# Patient Record
Sex: Male | Born: 1965 | Race: Black or African American | Hispanic: No | Marital: Single | Smoking: Current some day smoker
Health system: Southern US, Community
[De-identification: ages and names within clinical notes are randomized; demographics above are authoritative.]

## PROBLEM LIST (undated history)

## (undated) DIAGNOSIS — K219 Gastro-esophageal reflux disease without esophagitis: Secondary | ICD-10-CM

## (undated) DIAGNOSIS — J45909 Unspecified asthma, uncomplicated: Secondary | ICD-10-CM

## (undated) HISTORY — DX: Gastro-esophageal reflux disease without esophagitis: K21.9

---

## 1999-09-11 ENCOUNTER — Emergency Department (HOSPITAL_COMMUNITY): Admission: EM | Admit: 1999-09-11 | Discharge: 1999-09-11 | Payer: Self-pay | Admitting: Emergency Medicine

## 2000-12-08 ENCOUNTER — Emergency Department (HOSPITAL_COMMUNITY): Admission: EM | Admit: 2000-12-08 | Discharge: 2000-12-08 | Payer: Self-pay | Admitting: Emergency Medicine

## 2000-12-08 ENCOUNTER — Encounter: Payer: Self-pay | Admitting: Emergency Medicine

## 2003-01-11 ENCOUNTER — Emergency Department (HOSPITAL_COMMUNITY): Admission: EM | Admit: 2003-01-11 | Discharge: 2003-01-11 | Payer: Self-pay | Admitting: Emergency Medicine

## 2003-01-13 ENCOUNTER — Emergency Department (HOSPITAL_COMMUNITY): Admission: EM | Admit: 2003-01-13 | Discharge: 2003-01-13 | Payer: Self-pay | Admitting: Emergency Medicine

## 2005-05-19 ENCOUNTER — Encounter: Admission: RE | Admit: 2005-05-19 | Discharge: 2005-08-17 | Payer: Self-pay | Admitting: Surgery

## 2010-01-06 ENCOUNTER — Emergency Department (HOSPITAL_COMMUNITY): Admission: EM | Admit: 2010-01-06 | Discharge: 2010-01-06 | Payer: Self-pay | Admitting: Emergency Medicine

## 2011-08-30 ENCOUNTER — Encounter (HOSPITAL_COMMUNITY): Payer: Self-pay | Admitting: *Deleted

## 2011-08-30 ENCOUNTER — Emergency Department (HOSPITAL_COMMUNITY)
Admission: EM | Admit: 2011-08-30 | Discharge: 2011-08-30 | Disposition: A | Discharge status: Home / Self Care | Payer: Self-pay | Attending: Emergency Medicine | Admitting: Emergency Medicine

## 2011-08-30 DIAGNOSIS — K219 Gastro-esophageal reflux disease without esophagitis: Secondary | ICD-10-CM | POA: Insufficient documentation

## 2011-08-30 MED ORDER — OMEPRAZOLE 20 MG PO CPDR: 20.0000 mg | DELAYED_RELEASE_CAPSULE | Freq: Two times a day (BID) | ORAL | Status: DC

## 2011-08-30 NOTE — ED Notes (Signed)
Pt reports wants to be checked out for acid reflux- states has burning sensation in throat at times. States happened again last night. Denies any pain at this time.

## 2011-08-30 NOTE — ED Provider Notes (Signed)
History     CSN: [id number]  Arrival date & time 08/30/11  0944   First MD Initiated Contact with Patient 08/30/11 1040      Chief Complaint  Patient presents with  . Gastrophageal Reflux    (Consider location/radiation/quality/duration/timing/severity/associated sxs/prior treatment) Patient is a 46 y.o. male presenting with GERD. The history is provided by the patient.  Gastrophageal Reflux This is a chronic problem. The current episode started more than 1 year ago. Pertinent negatives include no chills or fever. Associated symptoms comments: He complains of "acid in my throat" that occurs when he lays down at night to sleep. It is described as 'liquid from my stomach' that causes a burning in his throat. He denies cough, abdominal pain or chest burning/pain. He states it is worse when he eats later at night, closer to bed time, and when he eats pizza. He is a smoker. No fever, bloody component to 'liquid' or nausea/vomiting.. Treatments tried: He has tried Tums without relief.    History reviewed. No pertinent past medical history.  History reviewed. No pertinent past surgical history.  No family history on file.  History  Substance Use Topics  . Smoking status: Current Some Day Smoker    Types: Cigarettes  . Smokeless tobacco: Not on file  . Alcohol Use: No      Review of Systems  Constitutional: Negative for fever and chills.  HENT: Negative.        See HPI.  Respiratory: Negative.   Cardiovascular: Negative.   Gastrointestinal: Negative.   Skin: Negative.   Neurological: Negative.     Allergies  Review of patient's allergies indicates no known allergies.  Home Medications   Current Outpatient Rx  Name Route Sig Dispense Refill  . OVER THE COUNTER MEDICATION Oral Take 1 tablet by mouth daily. Pt takes an otc allergy medication.      BP 134/67  Pulse 88  Temp(Src) 97.6 F (36.4 C) (Oral)  Resp 16  SpO2 98%  Physical Exam  Constitutional: He appears  well-developed and well-nourished.  HENT:  Head: Normocephalic.  Mouth/Throat: Oropharynx is clear and moist.  Neck: Normal range of motion. Neck supple. No thyromegaly present.  Cardiovascular: Normal rate and regular rhythm.   Pulmonary/Chest: Effort normal and breath sounds normal.  Abdominal: Soft. Bowel sounds are normal. There is no tenderness. There is no rebound and no guarding.  Musculoskeletal: Normal range of motion.  Neurological: He is alert. No cranial nerve deficit.  Skin: Skin is warm and dry. No rash noted.  Psychiatric: He has a normal mood and affect.    ED Course  Procedures (including critical care time)  Labs Reviewed - No data to display No results found.   No diagnosis found.    MDM  Discussed that there is no direct test for acid reflux as requested. Given no abdominal pain, chest pain, fever and symptoms for chronic duration, doubt ulcerations or other emergent condition. Will prescribe Prilosec and refer to GI in one week for recheck, further evaluation.        Rodena Medin, PA-C 08/30/11 1135

## 2011-08-30 NOTE — Discharge Instructions (Signed)
TAKE PRILOSEC TWICE DAILY. FOLLOW UP WITH DR. Dulce Sellar IN ONE WEEK BY CALLING THE OFFICE IN THE MORNING TO SCHEDULE A TIME TO BE SEEN. RETURN HERE WITH ANY HIGH FEVER, SEVERE PAIN OR NEW CONCERN.  .Diet for GERD or PUD Nutrition therapy can help ease the discomfort of gastroesophageal reflux disease (GERD) and peptic ulcer disease (PUD).  HOME CARE INSTRUCTIONS   Eat your meals slowly, in a relaxed setting.   Eat 5 to 6 small meals per day.   If a food causes distress, stop eating it for a period of time.  FOODS TO AVOID  Coffee, regular or decaffeinated.   Cola beverages, regular or low calorie.   Tea, regular or decaffeinated.   Pepper.   Cocoa.   High fat foods, including meats.   Butter, margarine, hydrogenated oil (trans fats).   Peppermint or spearmint (if you have GERD).   Fruits and vegetables if not tolerated.   Alcohol.   Nicotine (smoking or chewing). This is one of the most potent stimulants to acid production in the gastrointestinal tract.   Any food that seems to aggravate your condition.  If you have questions regarding your diet, ask your caregiver or a registered dietitian. TIPS  Lying flat may make symptoms worse. Keep the head of your bed raised 6 to 9 inches (15 to 23 cm) by using a foam wedge or blocks under the legs of the bed.   Do not lay down until 3 hours after eating a meal.   Daily physical activity may help reduce symptoms.  MAKE SURE YOU:   Understand these instructions.   Will watch your condition.   Will get help right away if you are not doing well or get worse.  Document Released: 06/07/2005 Document Revised: 05/27/2011 Document Reviewed: 04/23/2011 [facility] Patient Information 2012 [place], [place].

## 2011-09-07 ENCOUNTER — Encounter (HOSPITAL_COMMUNITY): Payer: Self-pay | Admitting: Emergency Medicine

## 2011-09-07 ENCOUNTER — Emergency Department (HOSPITAL_COMMUNITY)
Admission: EM | Admit: 2011-09-07 | Discharge: 2011-09-07 | Disposition: A | Discharge status: Home / Self Care | Payer: No Typology Code available for payment source | Attending: Emergency Medicine | Admitting: Emergency Medicine

## 2011-09-07 DIAGNOSIS — Y998 Other external cause status: Secondary | ICD-10-CM | POA: Insufficient documentation

## 2011-09-07 DIAGNOSIS — F17200 Nicotine dependence, unspecified, uncomplicated: Secondary | ICD-10-CM | POA: Insufficient documentation

## 2011-09-07 DIAGNOSIS — S39012A Strain of muscle, fascia and tendon of lower back, initial encounter: Secondary | ICD-10-CM

## 2011-09-07 DIAGNOSIS — Z79899 Other long term (current) drug therapy: Secondary | ICD-10-CM | POA: Insufficient documentation

## 2011-09-07 DIAGNOSIS — S161XXA Strain of muscle, fascia and tendon at neck level, initial encounter: Secondary | ICD-10-CM

## 2011-09-07 DIAGNOSIS — Y93I9 Activity, other involving external motion: Secondary | ICD-10-CM | POA: Insufficient documentation

## 2011-09-07 DIAGNOSIS — S139XXA Sprain of joints and ligaments of unspecified parts of neck, initial encounter: Secondary | ICD-10-CM | POA: Insufficient documentation

## 2011-09-07 DIAGNOSIS — IMO0002 Reserved for concepts with insufficient information to code with codable children: Secondary | ICD-10-CM | POA: Insufficient documentation

## 2011-09-07 MED ORDER — DIAZEPAM 5 MG PO TABS: 5.0000 mg | ORAL_TABLET | Freq: Two times a day (BID) | ORAL | Status: AC

## 2011-09-07 MED ORDER — DIAZEPAM 5 MG PO TABS
5.0000 mg | ORAL_TABLET | Freq: Once | ORAL | Status: AC
  Administered 2011-09-07: 5 mg via ORAL
  Filled 2011-09-07: qty 1

## 2011-09-07 MED ORDER — KETOROLAC TROMETHAMINE 60 MG/2ML IM SOLN
60.0000 mg | Freq: Once | INTRAMUSCULAR | Status: DC
  Filled 2011-09-07: qty 2

## 2011-09-07 MED ORDER — NAPROXEN 500 MG PO TABS: 500.0000 mg | ORAL_TABLET | Freq: Two times a day (BID) | ORAL | Status: AC
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
## 2011-09-08 ENCOUNTER — Emergency Department (HOSPITAL_COMMUNITY)
Admission: EM | Admit: 2011-09-08 | Discharge: 2011-09-09 | Disposition: A | Discharge status: Home / Self Care | Payer: No Typology Code available for payment source | Attending: Emergency Medicine | Admitting: Emergency Medicine

## 2011-09-08 ENCOUNTER — Emergency Department (HOSPITAL_COMMUNITY): Payer: No Typology Code available for payment source

## 2011-09-08 ENCOUNTER — Encounter (HOSPITAL_COMMUNITY): Payer: Self-pay | Admitting: Emergency Medicine

## 2011-09-08 DIAGNOSIS — F17200 Nicotine dependence, unspecified, uncomplicated: Secondary | ICD-10-CM | POA: Insufficient documentation

## 2011-09-08 DIAGNOSIS — M545 Low back pain, unspecified: Secondary | ICD-10-CM | POA: Insufficient documentation

## 2011-09-08 DIAGNOSIS — S339XXA Sprain of unspecified parts of lumbar spine and pelvis, initial encounter: Secondary | ICD-10-CM | POA: Insufficient documentation

## 2011-09-08 DIAGNOSIS — S39012A Strain of muscle, fascia and tendon of lower back, initial encounter: Secondary | ICD-10-CM
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
## 2011-09-09 MED ORDER — HYDROCODONE-ACETAMINOPHEN 5-325 MG PO TABS
1.0000 | ORAL_TABLET | Freq: Once | ORAL | Status: AC
  Administered 2011-09-09: 1 via ORAL
  Filled 2011-09-09: qty 1

## 2011-09-09 MED ORDER — HYDROCODONE-ACETAMINOPHEN 5-325 MG PO TABS: 1.0000 | ORAL_TABLET | ORAL | Status: AC | PRN

## 2011-09-09 MED ORDER — DIAZEPAM 5 MG PO TABS
5.0000 mg | ORAL_TABLET | Freq: Once | ORAL | Status: DC
  Filled 2011-09-09: qty 1

## 2011-09-09 MED ORDER — IBUPROFEN 800 MG PO TABS
800.0000 mg | ORAL_TABLET | Freq: Once | ORAL | Status: AC
  Administered 2011-09-09: 800 mg via ORAL
  Filled 2011-09-09: qty 1

## 2011-09-09 NOTE — ED Notes (Signed)
Patient called because he can not get to [facility] to get to see referral MD. Patient given number to [facility] since they were on call.
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
## 2011-09-09 NOTE — ED Notes (Signed)
The pt is sleeping and snoring loudly

## 2011-09-09 NOTE — ED Notes (Signed)
The pt did not want the valium.   He has some at home

## 2011-09-09 NOTE — Discharge Instructions (Signed)
  Please review the instructions below. Your x-ray tonight shows no acute injury or broken bones. The likely source of your symptoms is muscle strain in your lower back. Continue the anti-inflammatory and muscle relaxer that you were prescribed yesterday. I have prescribed a very short course of medication for pain as you should not need stronger medication for pain at this point. If the pain persists you will need to arrange follow up with the orthopedic referral provided.     Muscle Strain A muscle strain, or pulled muscle, occurs when a muscle is over-stretched. A small number of muscle fibers may also be torn. This is especially common in athletes. This happens when a sudden violent force placed on a muscle pushes it past its capacity. Usually, recovery from a pulled muscle takes 1 to 2 weeks. But complete healing will take 5 to 6 weeks. There are millions of muscle fibers. Following injury, your body will usually return to normal quickly. HOME CARE INSTRUCTIONS   While awake, apply ice to the sore muscle for 15 to 20 minutes each hour for the first 2 days. Put ice in a plastic bag and place a towel between the bag of ice and your skin.   Do not use the pulled muscle for several days. Do not use the muscle if you have pain.   You may wrap the injured area with an elastic bandage for comfort. Be careful not to bind it too tightly. This may interfere with blood circulation.   Only take over-the-counter or prescription medicines for pain, discomfort, or fever as directed by your caregiver. Do not use aspirin as this will increase bleeding (bruising) at injury site.   Warming up before exercise helps prevent muscle strains.  SEEK MEDICAL CARE IF:  There is increased pain or swelling in the affected area. MAKE SURE YOU:   Understand these instructions.   Will watch your condition.   Will get help right away if you are not doing well or get worse.  Document Released: 06/07/2005 Document  Revised: 05/27/2011 Document Reviewed: 01/04/2007 [facility] Patient Information 2012 [place], [place].

## 2011-09-16 NOTE — ED Provider Notes (Signed)
Evaluation and management procedures were performed by the PA/NP under my supervision/collaboration.   Yolunda Kloos D Rasheema Truluck, MD 09/16/11 1012 

## 2011-09-16 NOTE — ED Provider Notes (Signed)
Evaluation and management procedures were performed by the PA/NP under my supervision/collaboration.   Jencarlo Bonadonna D Viviene Thurston, MD 09/16/11 1025 

## 2013-04-24 ENCOUNTER — Encounter (HOSPITAL_COMMUNITY): Payer: Self-pay | Admitting: Emergency Medicine

## 2013-04-24 ENCOUNTER — Emergency Department (HOSPITAL_COMMUNITY)
Admission: EM | Admit: 2013-04-24 | Discharge: 2013-04-24 | Disposition: A | Discharge status: Home / Self Care | Payer: Self-pay | Attending: Emergency Medicine | Admitting: Emergency Medicine

## 2013-04-24 ENCOUNTER — Emergency Department (HOSPITAL_COMMUNITY): Payer: Self-pay

## 2013-04-24 DIAGNOSIS — Z79899 Other long term (current) drug therapy: Secondary | ICD-10-CM | POA: Insufficient documentation

## 2013-04-24 DIAGNOSIS — R0789 Other chest pain: Secondary | ICD-10-CM | POA: Insufficient documentation

## 2013-04-24 DIAGNOSIS — F17200 Nicotine dependence, unspecified, uncomplicated: Secondary | ICD-10-CM | POA: Insufficient documentation

## 2013-04-24 DIAGNOSIS — J069 Acute upper respiratory infection, unspecified: Secondary | ICD-10-CM | POA: Insufficient documentation

## 2013-04-24 MED ORDER — ALBUTEROL SULFATE HFA 108 (90 BASE) MCG/ACT IN AERS
2.0000 | INHALATION_SPRAY | Freq: Four times a day (QID) | RESPIRATORY_TRACT | Status: DC | PRN
  Administered 2013-04-24: 2 via RESPIRATORY_TRACT
  Filled 2013-04-24: qty 6.7

## 2013-04-24 MED ORDER — AZITHROMYCIN 250 MG PO TABS: 250.0000 mg | ORAL_TABLET | Freq: Every day | ORAL | Status: DC

## 2013-04-24 NOTE — ED Notes (Signed)
Sore throat x 1 week. "may have had fever". Use OTC dayquil

## 2013-04-24 NOTE — ED Provider Notes (Signed)
Medical screening examination/treatment/procedure(s) were performed by non-physician practitioner and as supervising physician I was immediately available for consultation/collaboration.  EKG Interpretation   None         Audree Camel, MD 04/24/13 1436

## 2013-04-24 NOTE — ED Provider Notes (Signed)
CSN: [id number]     Arrival date & time 04/24/13  1221 History  This chart was scribed for non-physician practitioner working with Audree Camel, MD by Ashley Jacobs, ED scribe. This patient was seen in room WTR8/WTR8 and the patient's care was started at 1:07 PM.  First MD Initiated Contact with Patient 04/24/13 1246     No chief complaint on file.  (Consider location/radiation/quality/duration/timing/severity/associated sxs/prior Treatment) The history is provided by the patient and medical records. No language interpreter was used.   HPI Comments: Scott Vance is a 47 y.o. male who presents to the Emergency Department complaining of moderate waxing waning sore throat for the past week. He states his having chest soreness, chest congestion,  fever (unmeasured )and a productive cough with yellow sputum. Pt mentions an episode of some streaky blood in sputum the night PTA. Pt has tried Vicks' Vapor Rub and mucus decongestants.  Pt does not have any known allergies or any previous medical complications.  No past medical history on file. No past surgical history on file. No family history on file. History  Substance Use Topics  . Smoking status: Current Some Day Smoker    Types: Cigarettes  . Smokeless tobacco: Not on file  . Alcohol Use: No    Review of Systems  Constitutional: Positive for fever.  HENT: Positive for sore throat.   Respiratory: Positive for cough and chest tightness.   All other systems reviewed and are negative.    Allergies  Review of patient's allergies indicates no known allergies.  Home Medications   Current Outpatient Rx  Name  Route  Sig  Dispense  Refill  . guaiFENesin (MUCINEX) 600 MG 12 hr tablet   Oral   Take 1,200 mg by mouth 2 (two) times daily.         . Pseudoephedrine-APAP-DM (DAYQUIL MULTI-SYMPTOM COLD/FLU PO)   Oral   Take 30 mLs by mouth 2 (two) times daily.          BP 118/71  Pulse 90  Temp(Src) 98.1 F (36.7 C) (Oral)   Resp 18  SpO2 98% Physical Exam  Nursing note and vitals reviewed. Constitutional: He is oriented to person, place, and time. He appears well-developed and well-nourished. No distress.  HENT:  Head: Normocephalic and atraumatic.  oropharynx mildly inflamed No tonsillary exudates Uvula midline Airway intact No signs of abscess   Eyes: EOM are normal.  Neck: Neck supple. No tracheal deviation present.  Cardiovascular: Normal rate and regular rhythm.  Exam reveals no gallop and no friction rub.   No murmur heard. Pulmonary/Chest: Effort normal and breath sounds normal. No respiratory distress. He has no wheezes. He has no rales. He exhibits no tenderness.  Abdominal: He exhibits no distension.  Musculoskeletal: Normal range of motion.  Neurological: He is alert and oriented to person, place, and time.  Skin: Skin is warm and dry.  Psychiatric: He has a normal mood and affect. His behavior is normal.    ED Course  Procedures (including critical care time) DIAGNOSTIC STUDIES: Oxygen Saturation is 98% on room air, normal by my interpretation.    COORDINATION OF CARE: 1:11 PM Discussed course of care with pt which includes Rapid Strep test and chest xrays. Pt understands and agrees. Results for orders placed during the hospital encounter of 04/24/13  RAPID STREP SCREEN      Result Value Range   Streptococcus, Group A Screen (Direct) NEGATIVE  NEGATIVE   Dg Chest 2 View  04/24/2013  CLINICAL DATA:  Chest pain and cough.  EXAM: CHEST  2 VIEW  COMPARISON:  01/06/2010.  FINDINGS: Mild right perihilar interstitial prominence. Mild pneumonitis cannot be excluded. Exam is otherwise unremarkable. Heart size normal. Mediastinum and hilar structures are unremarkable. Bony structures normal.  IMPRESSION: Mild right perihilar interstitial prominence. Very mild changes of pneumonitis cannot be excluded.   Electronically Signed   By: Maisie Fus  Register   On: 04/24/2013 13:45      EKG  Interpretation   None       MDM   1. URI (upper respiratory infection)    Pt CXR negative for acute infiltrate. Patients symptoms are consistent with URI, likely viral etiology. Discussed that antibiotics are not indicated for viral infections. Pt will be discharged with symptomatic treatment.  Verbalizes understanding and is agreeable with plan. Pt is hemodynamically stable & in NAD prior to dc.   Perc negative  I personally performed the services described in this documentation, which was scribed in my presence. The recorded information has been reviewed and is accurate.     Roxy Horseman, PA-C 04/24/13 1352

## 2013-04-26 LAB — CULTURE, GROUP A STREP

## 2014-04-15 ENCOUNTER — Emergency Department (HOSPITAL_COMMUNITY): Payer: Self-pay

## 2014-04-15 ENCOUNTER — Encounter (HOSPITAL_COMMUNITY): Payer: Self-pay | Admitting: Emergency Medicine

## 2014-04-15 ENCOUNTER — Emergency Department (HOSPITAL_COMMUNITY)
Admission: EM | Admit: 2014-04-15 | Discharge: 2014-04-15 | Disposition: A | Discharge status: Home / Self Care | Payer: Self-pay | Attending: Emergency Medicine | Admitting: Emergency Medicine

## 2014-04-15 DIAGNOSIS — Z720 Tobacco use: Secondary | ICD-10-CM | POA: Insufficient documentation

## 2014-04-15 DIAGNOSIS — E279 Disorder of adrenal gland, unspecified: Secondary | ICD-10-CM | POA: Insufficient documentation

## 2014-04-15 DIAGNOSIS — K859 Acute pancreatitis, unspecified: Secondary | ICD-10-CM | POA: Insufficient documentation

## 2014-04-15 DIAGNOSIS — R109 Unspecified abdominal pain: Secondary | ICD-10-CM

## 2014-04-15 DIAGNOSIS — N281 Cyst of kidney, acquired: Secondary | ICD-10-CM | POA: Insufficient documentation

## 2014-04-15 DIAGNOSIS — R1012 Left upper quadrant pain: Secondary | ICD-10-CM | POA: Insufficient documentation

## 2014-04-15 DIAGNOSIS — E278 Other specified disorders of adrenal gland: Secondary | ICD-10-CM

## 2014-04-15 LAB — CBC WITH DIFFERENTIAL/PLATELET
BASOS PCT: 1 % (ref 0–1)
Basophils Absolute: 0 10*3/uL (ref 0.0–0.1)
EOS ABS: 0.1 10*3/uL (ref 0.0–0.7)
EOS PCT: 3 % (ref 0–5)
HCT: 46.5 % (ref 39.0–52.0)
HEMOGLOBIN: 16.5 g/dL (ref 13.0–17.0)
Lymphocytes Relative: 43 % (ref 12–46)
Lymphs Abs: 1.6 10*3/uL (ref 0.7–4.0)
MCH: 33.1 pg (ref 26.0–34.0)
MCHC: 35.5 g/dL (ref 30.0–36.0)
MCV: 93.2 fL (ref 78.0–100.0)
MONOS PCT: 9 % (ref 3–12)
Monocytes Absolute: 0.3 10*3/uL (ref 0.1–1.0)
NEUTROS PCT: 44 % (ref 43–77)
Neutro Abs: 1.6 10*3/uL — ABNORMAL LOW (ref 1.7–7.7)
PLATELETS: 212 10*3/uL (ref 150–400)
RBC: 4.99 MIL/uL (ref 4.22–5.81)
RDW: 12.5 % (ref 11.5–15.5)
WBC: 3.6 10*3/uL — ABNORMAL LOW (ref 4.0–10.5)

## 2014-04-15 LAB — URINALYSIS, ROUTINE W REFLEX MICROSCOPIC
Bilirubin Urine: NEGATIVE
Glucose, UA: NEGATIVE mg/dL
HGB URINE DIPSTICK: NEGATIVE
Ketones, ur: NEGATIVE mg/dL
LEUKOCYTES UA: NEGATIVE
NITRITE: NEGATIVE
PROTEIN: NEGATIVE mg/dL
SPECIFIC GRAVITY, URINE: 1.023 (ref 1.005–1.030)
UROBILINOGEN UA: 1 mg/dL (ref 0.0–1.0)
pH: 7 (ref 5.0–8.0)

## 2014-04-15 LAB — COMPREHENSIVE METABOLIC PANEL
ALBUMIN: 3.9 g/dL (ref 3.5–5.2)
ALK PHOS: 85 U/L (ref 39–117)
ALT: 20 U/L (ref 0–53)
ANION GAP: 11 (ref 5–15)
AST: 19 U/L (ref 0–37)
BUN: 10 mg/dL (ref 6–23)
CALCIUM: 9.1 mg/dL (ref 8.4–10.5)
CO2: 25 mEq/L (ref 19–32)
Chloride: 105 mEq/L (ref 96–112)
Creatinine, Ser: 0.95 mg/dL (ref 0.50–1.35)
GFR calc Af Amer: >90 mL/min (ref >90)
GFR calc non Af Amer: >90 mL/min (ref >90)
Glucose, Bld: 101 mg/dL — ABNORMAL HIGH (ref 70–99)
POTASSIUM: 4 meq/L (ref 3.7–5.3)
Sodium: 141 mEq/L (ref 137–147)
TOTAL PROTEIN: 7.4 g/dL (ref 6.0–8.3)
Total Bilirubin: 0.3 mg/dL (ref 0.3–1.2)

## 2014-04-15 LAB — LIPASE, BLOOD: LIPASE: 151 U/L — AB (ref 11–59)

## 2014-04-15 MED ORDER — IOHEXOL 300 MG/ML  SOLN
100.0000 mL | Freq: Once | INTRAMUSCULAR | Status: AC | PRN
  Administered 2014-04-15: 100 mL via INTRAVENOUS

## 2014-04-15 MED ORDER — FAMOTIDINE 20 MG PO TABS: 20.0000 mg | ORAL_TABLET | Freq: Two times a day (BID) | ORAL | Status: DC

## 2014-04-15 MED ORDER — HYDROCODONE-ACETAMINOPHEN 5-325 MG PO TABS: 1.0000 | ORAL_TABLET | ORAL | Status: DC | PRN

## 2014-04-15 MED ORDER — IOHEXOL 300 MG/ML  SOLN
50.0000 mL | Freq: Once | INTRAMUSCULAR | Status: AC | PRN
  Administered 2014-04-15: 50 mL via ORAL

## 2014-04-15 NOTE — ED Provider Notes (Signed)
CSN: [id number]     Arrival date & time 04/15/14  1353 History   First MD Initiated Contact with Patient 04/15/14 1620     Chief Complaint  Patient presents with  . Abdominal Pain     (Consider location/radiation/quality/duration/timing/severity/associated sxs/prior Treatment) HPI Scott Vance is a 48 y.o. male who presents to emergency department complaining of epigastric abdominal pain for 1 week. States symptoms began after eating a chicken [place] from [employer]. Patient denies any associated nausea or vomiting. No diarrhea. Normal bowel movement today. States "I think I have food poisoning." He did try taking Pepto for his symptoms with no relief. He denies any alcohol drinking. He does take Prevacid for headaches regularly. He denies any blood in his stool or blood tarry stools. He denies any fever or chills. No back pain. No abdominal pain radiation. Nothing makes it better or worse. Patient is able to eat without difficulty. No hx of the same.   History reviewed. No pertinent past medical history. No past surgical history on file. No family history on file. History  Substance Use Topics  . Smoking status: Current Some Day Smoker    Types: Cigarettes  . Smokeless tobacco: Not on file  . Alcohol Use: No    Review of Systems  Constitutional: Negative for fever and chills.  Respiratory: Negative for cough, chest tightness and shortness of breath.   Cardiovascular: Negative for chest pain, palpitations and leg swelling.  Gastrointestinal: Positive for abdominal pain. Negative for nausea, vomiting, diarrhea and abdominal distention.  Genitourinary: Negative for dysuria, urgency, frequency and hematuria.  Musculoskeletal: Negative for arthralgias, myalgias, neck pain and neck stiffness.  Skin: Negative for rash.  Allergic/Immunologic: Negative for immunocompromised state.  Neurological: Negative for dizziness, weakness, light-headedness, numbness and headaches.  All other  systems reviewed and are negative.     Allergies  Review of patient's allergies indicates no known allergies.  Home Medications   Prior to Admission medications   Medication Sig Start Date End Date Taking? Authorizing Provider  bismuth subsalicylate (PEPTO BISMOL) 262 MG/15ML suspension Take 30 mLs by mouth every 6 (six) hours as needed (stomach indigestion).   Yes Historical Provider, MD  GuaiFENesin (MUCUS RELIEF ADULT PO) Take 2 tablets by mouth daily as needed (cold symptoms).   Yes Historical Provider, MD  sodium-potassium bicarbonate (ALKA-SELTZER GOLD) TBEF dissolvable tablet Take 1 tablet by mouth daily as needed (indigestion).   Yes Historical Provider, MD   BP 148/85  Pulse 80  Temp(Src) 97.5 F (36.4 C) (Oral)  Resp 18  SpO2 98% Physical Exam  Nursing note and vitals reviewed. Constitutional: He is oriented to person, place, and time. He appears well-developed and well-nourished. No distress.  HENT:  Head: Normocephalic and atraumatic.  Right Ear: External ear normal.  Left Ear: External ear normal.  Mouth/Throat: Oropharynx is clear and moist.  Eyes: Conjunctivae are normal.  Neck: Normal range of motion. Neck supple.  No meningeal signs  Cardiovascular: Normal rate, regular rhythm and normal heart sounds.   Pulmonary/Chest: Effort normal and breath sounds normal. No respiratory distress. He has no wheezes. He has no rales.  Abdominal: Soft. Bowel sounds are normal. There is tenderness. There is no rebound and no guarding.  LUQ tenderness. No CVA tednerness  Musculoskeletal: He exhibits no edema and no tenderness.  Lymphadenopathy:    He has no cervical adenopathy.  Neurological: He is alert and oriented to person, place, and time.  Skin: Skin is warm and dry. No erythema.  Psychiatric: He has a normal mood and affect.    ED Course  Procedures (including critical care time) Labs Review Labs Reviewed  CBC WITH DIFFERENTIAL - Abnormal; Notable for the  following:    WBC 3.6 (*)    Neutro Abs 1.6 (*)    All other components within normal limits  COMPREHENSIVE METABOLIC PANEL - Abnormal; Notable for the following:    Glucose, Bld 101 (*)    All other components within normal limits  LIPASE, BLOOD - Abnormal; Notable for the following:    Lipase 151 (*)    All other components within normal limits  URINALYSIS, ROUTINE W REFLEX MICROSCOPIC - Abnormal; Notable for the following:    APPearance CLOUDY (*)    All other components within normal limits    Imaging Review Ct Abdomen Pelvis W Contrast  04/15/2014   CLINICAL DATA:  Mid abdominal pain after eating. Patient's symptoms for 1 week. Patient believes he had food poisoning 1 week ago. No vomiting.  EXAM: CT ABDOMEN AND PELVIS WITH CONTRAST  TECHNIQUE: Multidetector CT imaging of the abdomen and pelvis was performed using the standard protocol following bolus administration of intravenous contrast.  CONTRAST:  50mL OMNIPAQUE IOHEXOL 300 MG/ML SOLN, 100mL OMNIPAQUE IOHEXOL 300 MG/ML SOLN  COMPARISON:  None.  FINDINGS: Clear lung bases.  Heart is normal in size.  Liver, spleen, gallbladder, pancreas: Normal.  8 mm right adrenal mass, nonspecific. Adrenal glands otherwise unremarkable.  Low-density left midpole renal mass measuring 9 mm, most likely a cyst. No other renal abnormality. No hydronephrosis. Normal ureters. Bladder only minimally distended but otherwise unremarkable.  No pathologically enlarged lymph nodes. No abnormal fluid collections.  There are few sigmoid colon diverticula. No diverticulitis. Colon otherwise unremarkable. Normal small bowel. Normal appendix.  No evidence of stomach wall thickening or of an ulcer. No mesenteric inflammation.  No significant bony abnormality.  IMPRESSION: 1. No acute findings.  No findings to explain mid abdominal pain. 2. 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 3. 9 mm left renal mass, most likely a cyst but too small to fully  characterize. 4. Several sigmoid colon diverticula.  No diverticulitis. 5. No other abnormalities.   Electronically Signed   By: Amie Portlandavid  Ormond M.D.   On: 04/15/2014 18:29     EKG Interpretation None      MDM   Final diagnoses:  Abdominal pain  Acute pancreatitis, unspecified pancreatitis type  Cyst of left kidney  Adrenal mass, right    Pt with LUQ abdominal pain for 1 week. No N/V/D. Will get labs. Abdomen benign. VS normal.     Labs showed mildly elevated lipase at 150. Pt is not a drinker. No RUQ pain. No N/V. Not on any medications. ill add CT abd/pelvis.    6:53 PM CT negative for acute finding. Did show right adrenal mass and left kidney cyst. No evidence of pancreatitis on CT. At this time, i belive pt is stable for outpatient follow up. Pain minimal at present. Tolerating POs. Pt ate entire take out box of food that his friend brought him when was told he was NPO. Discussed that we would put him on clear liquid diet for 48 hrs, anti acids, pain meds. Follow up with pcp. Return precautions discussed.   Filed Vitals:   04/15/14 1421 04/15/14 1834 04/15/14 1837  BP: 133/75 148/85   Pulse: 94 80   Temp: 98.4 F (36.9 C)  97.5 F (36.4 C)  TempSrc: Oral  Oral  Resp: 18 18   SpO2: 100% 98%      Oather Muilenburg A Jonesha Tsuchiya, PA-C 04/16/14 0030

## 2014-04-15 NOTE — Discharge Instructions (Signed)
Pepcid for acid reflux as prescribed. Norco for severe pain. Clear liquids for 24 hrs. Follow up with primary care doctor. Return if symptoms worsening   Acute Pancreatitis Acute pancreatitis is a disease in which the pancreas becomes suddenly inflamed. The pancreas is a large gland located behind your stomach. The pancreas produces enzymes that help digest food. The pancreas also releases the hormones glucagon and insulin that help regulate blood sugar. Damage to the pancreas occurs when the digestive enzymes from the pancreas are activated and begin attacking the pancreas before being released into the intestine. Most acute attacks last a couple of days and can cause serious complications. Some people become dehydrated and develop low blood pressure. In severe cases, bleeding into the pancreas can lead to shock and can be life-threatening. The lungs, heart, and kidneys may fail. CAUSES  Pancreatitis can happen to anyone. In some cases, the cause is unknown. Most cases are caused by:  Alcohol abuse.  Gallstones. Other less common causes are:  Certain medicines.  Exposure to certain chemicals.  Infection.  Damage caused by an accident (trauma).  Abdominal surgery. SYMPTOMS   Pain in the upper abdomen that may radiate to the back.  Tenderness and swelling of the abdomen.  Nausea and vomiting. DIAGNOSIS  Your caregiver will perform a physical exam. Blood and stool tests may be done to confirm the diagnosis. Imaging tests may also be done, such as X-rays, CT scans, or an ultrasound of the abdomen. TREATMENT  Treatment usually requires a stay in the hospital. Treatment may include:  Pain medicine.  Fluid replacement through an intravenous line (IV).  Placing a tube in the stomach to remove stomach contents and control vomiting.  Not eating for 3 or 4 days. This gives your pancreas a rest, because enzymes are not being produced that can cause further damage.  Antibiotic medicines  if your condition is caused by an infection.  Surgery of the pancreas or gallbladder. HOME CARE INSTRUCTIONS   Follow the diet advised by your caregiver. This may involve avoiding alcohol and decreasing the amount of fat in your diet.  Eat smaller, more frequent meals. This reduces the amount of digestive juices the pancreas produces.  Drink enough fluids to keep your urine clear or pale yellow.  Only take over-the-counter or prescription medicines as directed by your caregiver.  Avoid drinking alcohol if it caused your condition.  Do not smoke.  Get plenty of rest.  Check your blood sugar at home as directed by your caregiver.  Keep all follow-up appointments as directed by your caregiver. SEEK MEDICAL CARE IF:   You do not recover as quickly as expected.  You develop new or worsening symptoms.  You have persistent pain, weakness, or nausea.  You recover and then have another episode of pain. SEEK IMMEDIATE MEDICAL CARE IF:   You are unable to eat or keep fluids down.  Your pain becomes severe.  You have a fever or persistent symptoms for more than 2 to 3 days.  You have a fever and your symptoms suddenly get worse.  Your skin or the white part of your eyes turn yellow (jaundice).  You develop vomiting.  You feel dizzy, or you faint.  Your blood sugar is high (over 300 mg/dL). MAKE SURE YOU:   Understand these instructions.  Will watch your condition.  Will get help right away if you are not doing well or get worse. Document Released: 06/07/2005 Document Revised: 12/07/2011 Document Reviewed: 09/16/2011 [employer] Patient Information  2015 [employer]. This information is not intended to replace advice given to you by your health care provider. Make sure you discuss any questions you have with your health care provider. ° °

## 2014-04-15 NOTE — ED Notes (Signed)
Pt believes he has food poisoning x 1 wk w/ gen abd pain.  No NVD.

## 2014-04-16 NOTE — ED Provider Notes (Signed)
Medical screening examination/treatment/procedure(s) were performed by non-physician practitioner and as supervising physician I was immediately available for consultation/collaboration.   EKG Interpretation None        Gilda Creasehristopher J. Pollina, MD 04/16/14 [phone]

## 2015-02-24 ENCOUNTER — Encounter (HOSPITAL_COMMUNITY): Payer: Self-pay | Admitting: *Deleted

## 2015-02-24 ENCOUNTER — Emergency Department (HOSPITAL_COMMUNITY)
Admission: EM | Admit: 2015-02-24 | Discharge: 2015-02-24 | Disposition: A | Discharge status: Home / Self Care | Payer: Self-pay | Attending: Emergency Medicine | Admitting: Emergency Medicine

## 2015-02-24 DIAGNOSIS — Z79899 Other long term (current) drug therapy: Secondary | ICD-10-CM | POA: Insufficient documentation

## 2015-02-24 DIAGNOSIS — Z720 Tobacco use: Secondary | ICD-10-CM | POA: Insufficient documentation

## 2015-02-24 DIAGNOSIS — H109 Unspecified conjunctivitis: Secondary | ICD-10-CM | POA: Insufficient documentation

## 2015-02-24 MED ORDER — TETRACAINE HCL 0.5 % OP SOLN
2.0000 [drp] | Freq: Once | OPHTHALMIC | Status: AC
  Administered 2015-02-24: 2 [drp] via OPHTHALMIC
  Filled 2015-02-24: qty 2

## 2015-02-24 MED ORDER — NEOMYCIN-POLYMYXIN-DEXAMETH 3.5-10000-0.1 OP OINT
TOPICAL_OINTMENT | Freq: Three times a day (TID) | OPHTHALMIC | Status: DC
  Administered 2015-02-24: 14:00:00 via OPHTHALMIC
  Filled 2015-02-24: qty 3.5

## 2015-02-24 MED ORDER — FLUORESCEIN SODIUM 1 MG OP STRP
1.0000 | ORAL_STRIP | Freq: Once | OPHTHALMIC | Status: AC
  Administered 2015-02-24: 1 via OPHTHALMIC
  Filled 2015-02-24: qty 1

## 2015-02-24 NOTE — Discharge Instructions (Signed)
Cool compresses to the eye. Eye drops three times a day for 7 days. Follow up with eye specialist if not improving.    Conjunctivitis Conjunctivitis is commonly called "pink eye." Conjunctivitis can be caused by bacterial or viral infection, allergies, or injuries. There is usually redness of the lining of the eye, itching, discomfort, and sometimes discharge. There may be deposits of matter along the eyelids. A viral infection usually causes a watery discharge, while a bacterial infection causes a yellowish, thick discharge. Pink eye is very contagious and spreads by direct contact. You may be given antibiotic eyedrops as part of your treatment. Before using your eye medicine, remove all drainage from the eye by washing gently with warm water and cotton balls. Continue to use the medication until you have awakened 2 mornings in a row without discharge from the eye. Do not rub your eye. This increases the irritation and helps spread infection. Use separate towels from other household members. Wash your hands with soap and water before and after touching your eyes. Use cold compresses to reduce pain and sunglasses to relieve irritation from light. Do not wear contact lenses or wear eye makeup until the infection is gone. SEEK MEDICAL CARE IF:   Your symptoms are not better after 3 days of treatment.  You have increased pain or trouble seeing.  The outer eyelids become very red or swollen. Document Released: 07/15/2004 Document Revised: 08/30/2011 Document Reviewed: 06/07/2005 [facility] Patient Information 2015 [place], [place]. This information is not intended to replace advice given to you by your health care provider. Make sure you discuss any questions you have with your health care provider.

## 2015-02-24 NOTE — ED Provider Notes (Signed)
History  This chart was scribed for non-physician practitioner, Jaynie Crumble, PA-C,working with Mancel Bale, MD, by Karle Plumber, ED Scribe. This patient was seen in room WTR8/WTR8 and the patient's care was started at 1:12 PM.  Chief Complaint  Patient presents with  . Eye Problem    The history is provided by the patient and medical records. No language interpreter was used.    HPI Comments:  Scott Vance is a 49 y.o. male who presents to the Emergency Department complaining of aching, sharp, burning right eye pain that began about one week ago. He reports associated itching, photophobia, tearing and intermittent blurriness. He states he has been instilling OTC ophthalmic drops with no significant relief of the symptoms. Covering his eye with his hand helps alleviate the pain. He denies worsening factors. He denies fever, chills, HA, nausea or vomiting. He denies any injury or trauma to the eye. He denies wearing corrective lenses or contact lenses. He does not have an eye doctor.   History reviewed. No pertinent past medical history. History reviewed. No pertinent past surgical history. History reviewed. No pertinent family history. Social History  Substance Use Topics  . Smoking status: Current Some Day Smoker    Types: Cigarettes  . Smokeless tobacco: None  . Alcohol Use: No    Review of Systems  Constitutional: Negative for fever and chills.  Eyes: Positive for photophobia, pain, discharge, itching and visual disturbance.  Gastrointestinal: Negative for nausea and vomiting.  Neurological: Negative for headaches.    Allergies  Review of patient's allergies indicates no known allergies.  Home Medications   Prior to Admission medications   Medication Sig Start Date End Date Taking? Authorizing Provider  bismuth subsalicylate (PEPTO BISMOL) 262 MG/15ML suspension Take 30 mLs by mouth every 6 (six) hours as needed (stomach indigestion).    Historical Provider, MD   famotidine (PEPCID) 20 MG tablet Take 1 tablet (20 mg total) by mouth 2 (two) times daily. 04/15/14   Climmie Buelow, PA-C  GuaiFENesin (MUCUS RELIEF ADULT PO) Take 2 tablets by mouth daily as needed (cold symptoms).    Historical Provider, MD  HYDROcodone-acetaminophen (NORCO/VICODIN) 5-325 MG per tablet Take 1 tablet by mouth every 4 (four) hours as needed for moderate pain or severe pain. 04/15/14   Kaira Stringfield, PA-C  sodium-potassium bicarbonate (ALKA-SELTZER GOLD) TBEF dissolvable tablet Take 1 tablet by mouth daily as needed (indigestion).    Historical Provider, MD   Triage Vitals: BP 132/78 mmHg  Pulse 74  Temp(Src) 98.2 F (36.8 C) (Oral)  Resp 16  SpO2 98% Physical Exam  Constitutional: He is oriented to person, place, and time. He appears well-developed and well-nourished.  HENT:  Head: Normocephalic and atraumatic.  Eyes: Conjunctivae and EOM are normal. Pupils are equal, round, and reactive to light. Right eye exhibits no exudate and no hordeolum. No foreign body present in the right eye. Right conjunctiva is not injected. Right conjunctiva has no hemorrhage. Left conjunctiva is not injected. Left conjunctiva has no hemorrhage.  Slit lamp exam:      The right eye shows no corneal abrasion, no corneal flare, no corneal ulcer, no foreign body, no hyphema and no hypopyon.  Neck: Normal range of motion.  Cardiovascular: Normal rate.   Pulmonary/Chest: Effort normal.  Musculoskeletal: Normal range of motion.  Neurological: He is alert and oriented to person, place, and time.  Skin: Skin is warm and dry.  Psychiatric: He has a normal mood and affect. His behavior is normal.  Nursing note and vitals reviewed.   ED Course  Procedures (including critical care time) DIAGNOSTIC STUDIES: Oxygen Saturation is 98% on RA, normal by my interpretation.   COORDINATION OF CARE: 1:15 PM- Will instill Tetracaine opthalmic drops and apply fluorescein strip to examine eye. Pt  verbalizes understanding and agrees to plan.  Medications  tetracaine (PONTOCAINE) 0.5 % ophthalmic solution 2 drop (not administered)  fluorescein ophthalmic strip 1 strip (not administered)    Labs Review Labs Reviewed - No data to display  Imaging Review No results found. I have personally reviewed and evaluated these images and lab results as part of my medical decision-making.   EKG Interpretation None      MDM   Final diagnoses:  Conjunctivitis, right eye    Pt with right eye itching, pain, intermittent blurred vision. Eye is not injected. No drainage on exam. Fluorescein stain negative. Eye pressure average 18 in the right eye. Will follow up with ophthalmology regarding his visual acuity and for further evaluation if not improving. Will treat with antibiotics steroid drops. Most likely allergic conjunctivitis versus bacterial conjunctivitis. Return precautions discussed.   Filed Vitals:   02/24/15 1205  BP: 132/78  Pulse: 74  Temp: 98.2 F (36.8 C)  TempSrc: Oral  Resp: 16  SpO2: 98%    I personally performed the services described in this documentation, which was scribed in my presence. The recorded information has been reviewed and is accurate.    Jaynie Crumble, PA-C 02/24/15 1351  Mancel Bale, MD 02/24/15 1524

## 2015-02-24 NOTE — ED Notes (Signed)
Pt reports right eye irritation x1 week. Pain 7/10. Has been using eye drops with no relief. Denies trauma or injury.

## 2015-02-24 NOTE — ED Notes (Signed)
Pt is a&ox4, ambulatory. He denied questions r/t dc instruction.

## 2015-06-17 ENCOUNTER — Encounter (HOSPITAL_COMMUNITY): Payer: Self-pay | Admitting: Family Medicine

## 2015-06-17 ENCOUNTER — Emergency Department (HOSPITAL_COMMUNITY)
Admission: EM | Admit: 2015-06-17 | Discharge: 2015-06-17 | Disposition: A | Discharge status: Home / Self Care | Payer: Self-pay | Attending: Physician Assistant | Admitting: Physician Assistant

## 2015-06-17 DIAGNOSIS — F17210 Nicotine dependence, cigarettes, uncomplicated: Secondary | ICD-10-CM | POA: Insufficient documentation

## 2015-06-17 DIAGNOSIS — R0981 Nasal congestion: Secondary | ICD-10-CM | POA: Insufficient documentation

## 2015-06-17 DIAGNOSIS — Z79899 Other long term (current) drug therapy: Secondary | ICD-10-CM | POA: Insufficient documentation

## 2015-06-17 MED ORDER — FLUTICASONE PROPIONATE 50 MCG/ACT NA SUSP: 2.0000 | Freq: Every day | NASAL | Status: DC

## 2015-06-17 NOTE — ED Notes (Signed)
Patient reports he is experiencing a "blockage" to his left nostril. Pt reports the feels like there is a bump in his nose that is swelling the left side of nose and upper lip. No obvious major swelling noted. Pt is delaying in answering questions due to using cell phone while triaging.

## 2015-06-17 NOTE — ED Provider Notes (Signed)
History  By signing my name below, I, Karle Plumber, attest that this documentation has been prepared under the direction and in the presence of Sharilyn Sites, PA-C. Electronically Signed: Karle Plumber, ED Scribe. 06/17/2015. 9:24 PM.  Chief Complaint  Patient presents with  . Nasal Congestion   The history is provided by the patient and medical records. No language interpreter was used.    HPI Comments:  Scott Vance is a 49 y.o. obese male who presents to the Emergency Department complaining of sharp left nasal pain and congestion that began about four . He states he feels as if there is a bump in the left nare. He has tried an OTC nasal spray with no significant relief of the symptoms but he cannot remember the names. He also applied peroxide to a tissue and placed it in his nose. He denies modifying factors. He denies fever, chills, nausea or vomiting.  No cough, chest congestion, or SOB.  VSS.  History reviewed. No pertinent past medical history. History reviewed. No pertinent past surgical history. History reviewed. No pertinent family history. Social History  Substance Use Topics  . Smoking status: Current Some Day Smoker    Types: Cigarettes  . Smokeless tobacco: None  . Alcohol Use: No    Review of Systems  Constitutional: Negative for fever and chills.  HENT: Positive for congestion.   Gastrointestinal: Negative for nausea and vomiting.  All other systems reviewed and are negative.   Allergies  Review of patient's allergies indicates no known allergies.  Home Medications   Prior to Admission medications   Medication Sig Start Date End Date Taking? Authorizing Provider  bismuth subsalicylate (PEPTO BISMOL) 262 MG/15ML suspension Take 30 mLs by mouth every 6 (six) hours as needed (stomach indigestion).    Historical Provider, MD  famotidine (PEPCID) 20 MG tablet Take 1 tablet (20 mg total) by mouth 2 (two) times daily. 04/15/14   Tatyana Kirichenko, PA-C   GuaiFENesin (MUCUS RELIEF ADULT PO) Take 2 tablets by mouth daily as needed (cold symptoms).    Historical Provider, MD  HYDROcodone-acetaminophen (NORCO/VICODIN) 5-325 MG per tablet Take 1 tablet by mouth every 4 (four) hours as needed for moderate pain or severe pain. 04/15/14   Tatyana Kirichenko, PA-C  sodium-potassium bicarbonate (ALKA-SELTZER GOLD) TBEF dissolvable tablet Take 1 tablet by mouth daily as needed (indigestion).    Historical Provider, MD   Triage Vitals: BP 153/87 mmHg  Pulse 87  Temp(Src) 98.2 F (36.8 C) (Oral)  Resp 18  Ht  (1.727 m)  Wt 230 lb (104.327 kg)  BMI 34.98 kg/m2  SpO2 99% Physical Exam  Constitutional: He is oriented to person, place, and time. He appears well-developed and well-nourished.  HENT:  Head: Normocephalic and atraumatic.  Right Ear: Tympanic membrane and ear canal normal.  Left Ear: Tympanic membrane and ear canal normal.  Nose: Mucosal edema present.  Mouth/Throat: Uvula is midline, oropharynx is clear and moist and mucous membranes are normal. No oropharyngeal exudate, posterior oropharyngeal edema, posterior oropharyngeal erythema or tonsillar abscesses.  Mucosal edema in left nares-- turbinates swollen and erythematous; no external swelling of nose or lip noted  Eyes: Conjunctivae and EOM are normal. Pupils are equal, round, and reactive to light.  Neck: Normal range of motion.  Cardiovascular: Normal rate, regular rhythm and normal heart sounds.   Pulmonary/Chest: Effort normal and breath sounds normal.  Abdominal: Soft. Bowel sounds are normal.  Musculoskeletal: Normal range of motion.  Neurological: He is alert and oriented  to person, place, and time.  Skin: Skin is warm and dry.  Psychiatric: He has a normal mood and affect.  Nursing note and vitals reviewed.   ED Course  Procedures (including critical care time) DIAGNOSTIC STUDIES: Oxygen Saturation is 99% on RA, normal by my interpretation.   COORDINATION OF  CARE: 9:21 PM- Will prescribe Flonase. Pt verbalizes understanding and agrees to plan.  Medications - No data to display   MDM   Final diagnoses:  Nasal congestion   49 y.o. M here with left sided nasal congestion.  He reports left nasal/lip swelling, however none noted on exam.  His left turbinates are swollen and erythematous, right nare appears normal.  Lungs clear without any respiratory symptoms.  Will start on flonase.  Discussed plan with patient, he/she acknowledged understanding and agreed with plan of care.  Return precautions given for new or worsening symptoms.  I personally performed the services described in this documentation, which was scribed in my presence. The recorded information has been reviewed and is accurate.  Garlon HatchetLisa M Sanders, PA-C 06/17/15 2143  Courteney Randall AnLyn Mackuen, MD 06/18/15 [id number]

## 2015-06-17 NOTE — Discharge Instructions (Signed)
Take the prescribed medication as directed.  May also wish to take over the counter cold medication such as theraflu, dayquil, tylenol cold and sinus. Follow-up with your primary care physician. Return to the ED for new or worsening symptoms.

## 2015-06-17 NOTE — ED Notes (Signed)
Attempted to call report but no answer.

## 2016-02-14 ENCOUNTER — Emergency Department (HOSPITAL_COMMUNITY)
Admission: EM | Admit: 2016-02-14 | Discharge: 2016-02-14 | Disposition: A | Discharge status: Home / Self Care | Payer: Self-pay | Attending: Emergency Medicine | Admitting: Emergency Medicine

## 2016-02-14 ENCOUNTER — Encounter (HOSPITAL_COMMUNITY): Payer: Self-pay | Admitting: *Deleted

## 2016-02-14 DIAGNOSIS — R609 Edema, unspecified: Secondary | ICD-10-CM | POA: Insufficient documentation

## 2016-02-14 DIAGNOSIS — Z79899 Other long term (current) drug therapy: Secondary | ICD-10-CM | POA: Insufficient documentation

## 2016-02-14 DIAGNOSIS — Z7951 Long term (current) use of inhaled steroids: Secondary | ICD-10-CM | POA: Insufficient documentation

## 2016-02-14 DIAGNOSIS — F17210 Nicotine dependence, cigarettes, uncomplicated: Secondary | ICD-10-CM | POA: Insufficient documentation

## 2016-02-14 DIAGNOSIS — W5503XA Scratched by cat, initial encounter: Secondary | ICD-10-CM | POA: Insufficient documentation

## 2016-02-14 DIAGNOSIS — R220 Localized swelling, mass and lump, head: Secondary | ICD-10-CM

## 2016-02-14 DIAGNOSIS — Y999 Unspecified external cause status: Secondary | ICD-10-CM | POA: Insufficient documentation

## 2016-02-14 DIAGNOSIS — Y929 Unspecified place or not applicable: Secondary | ICD-10-CM | POA: Insufficient documentation

## 2016-02-14 DIAGNOSIS — Y939 Activity, unspecified: Secondary | ICD-10-CM | POA: Insufficient documentation

## 2016-02-14 MED ORDER — ACETAMINOPHEN 325 MG PO TABS
650.0000 mg | ORAL_TABLET | Freq: Once | ORAL | Status: AC
  Administered 2016-02-14: 650 mg via ORAL
  Filled 2016-02-14: qty 2

## 2016-02-14 NOTE — Discharge Instructions (Signed)
Read the information below.   Your cat scratch does not show any obvious signs of infection at this time. You do not have a fever. Continue to keep the scratch clean and dry. You can continue to apply antibiotic ointment. Please take tylenol 650mg  every 6hrs of motrin 400mg  every 6hrs for pain and swelling. Apply ice for 20 minute increments for the next 2-3 days.  If the facial swelling persists over the next week, please follow up with a primary doctor, I [facility] as well as additional resources. You may return to the Emergency Department at any time for worsening condition or any new symptoms that concern you. Return to the ED if you develop a fever, difficulty swallowing, difficulty breathing, have redness/warmth/swelling/drainage or streaking at site of scratches.

## 2016-02-14 NOTE — ED Triage Notes (Signed)
Pt complains of swelling to left side of his face since a cat bit him on the face Thursday.

## 2016-02-14 NOTE — Progress Notes (Signed)
Pt states he was scratched by a cat on Thursday. He has two small scratch marks on the left ankle. Pt stated, "the lady that owned the cat said it had all its shots. But you know a person will tell you that." pt also c/o swelling to his left jaw and a bump on his head .

## 2016-02-14 NOTE — ED Provider Notes (Signed)
[facility]-EMERGENCY DEPT Provider Note   CSN: [id number] Arrival date & time: 02/14/16  1549  By signing my name below, I, Nelwyn Salisbury, attest that this documentation has been prepared under the direction and in the presence of non-physician practitioner, Arvilla Meres, PA-C.Marland Kitchen Electronically Signed: Nelwyn Salisbury, Scribe. 02/14/2016. 5:19 PM.  History   Chief Complaint Chief Complaint  Patient presents with  . Animal Bite   The history is provided by the patient. No language interpreter was used.     HPI Comments:  Scott Vance is a 50 y.o. male who presents to the Emergency Department complaining of cat scratch s/p 2 days ago. Patient reports he was scratched by a friend's cat on his lower leg 2 days ago. No cat bite. Cat is UTD on vaccines, indoors, and no known exposure to rabies. He noticed later in the day he had left sided facial swelling with some associated pain and is concerned about a bacterial infection or rabies from the cat scratch. He notes he has been keeping the scratch clean and applying neosporin. He reports associated fatigue and chills. He denies fever, weight change, URI symptoms, dental pain, oral swelling, sore throat, tinnitus, trouble eating, trouble swallowing, changes in vision, headache, numbness, weakness, chest pain, shortness of breath, rash, abdominal pain, nausea, vomiting, myalgias, neck pain, dysuria, hematuria, or any immunocompromising conditions. No h/o cancer. No known trauma.     History reviewed. No pertinent past medical history.  There are no active problems to display for this patient.   History reviewed. No pertinent surgical history.   Home Medications    Prior to Admission medications   Medication Sig Start Date End Date Taking? Authorizing Provider  bismuth subsalicylate (PEPTO BISMOL) 262 MG/15ML suspension Take 30 mLs by mouth every 6 (six) hours as needed (stomach indigestion).    Historical Provider, MD  famotidine (PEPCID) 20 MG  tablet Take 1 tablet (20 mg total) by mouth 2 (two) times daily. 04/15/14   Tatyana Kirichenko, PA-C  fluticasone (FLONASE) 50 MCG/ACT nasal spray Place 2 sprays into both nostrils daily. 06/17/15   Garlon Hatchet, PA-C  GuaiFENesin (MUCUS RELIEF ADULT PO) Take 2 tablets by mouth daily as needed (cold symptoms).    Historical Provider, MD  HYDROcodone-acetaminophen (NORCO/VICODIN) 5-325 MG per tablet Take 1 tablet by mouth every 4 (four) hours as needed for moderate pain or severe pain. 04/15/14   Tatyana Kirichenko, PA-C  sodium-potassium bicarbonate (ALKA-SELTZER GOLD) TBEF dissolvable tablet Take 1 tablet by mouth daily as needed (indigestion).    Historical Provider, MD    Family History No family history on file.  Social History Social History  Substance Use Topics  . Smoking status: Current Some Day Smoker    Types: Cigarettes  . Smokeless tobacco: Never Used  . Alcohol use No     Allergies   Review of patient's allergies indicates no known allergies.   Review of Systems Review of Systems  Constitutional: Positive for fatigue. Negative for fever and unexpected weight change.  HENT: Positive for facial swelling. Negative for dental problem, sore throat, tinnitus and trouble swallowing.   Respiratory: Negative for shortness of breath.   Cardiovascular: Negative for chest pain.  Gastrointestinal: Negative for nausea and vomiting.  Genitourinary: Negative for dysuria and hematuria.  Musculoskeletal: Positive for myalgias (Jaw).  Skin: Positive for wound. Negative for rash.  Allergic/Immunologic: Negative for immunocompromised state.  Neurological: Negative for weakness, numbness and headaches.     Physical Exam Updated Vital Signs BP 141/68 (  BP Location: Left Arm)   Pulse 92   Temp 98.6 F (37 C) (Oral)   Resp 20   Ht 5\' 10"  (1.778 m)   Wt 99.8 kg   SpO2 94%   BMI 31.57 kg/m   Physical Exam  Constitutional: He appears well-developed and well-nourished. No  distress.  HENT:  Head: Normocephalic and atraumatic.  Nose: Right sinus exhibits no maxillary sinus tenderness and no frontal sinus tenderness. Left sinus exhibits no maxillary sinus tenderness and no frontal sinus tenderness.  Mouth/Throat: Uvula is midline, oropharynx is clear and moist and mucous membranes are normal. No oral lesions. No trismus in the jaw. No uvula swelling. No oropharyngeal exudate. No tonsillar exudate.  Mild left sided facial swelling noted. No warmth, erythema, induration, or drainage. No trismus. Uvula midline. No sublingual or soft palate swelling. No erythema of posterior oropharynx. No tonsillar hypertrophy or exudate.   Eyes: Conjunctivae and EOM are normal. Pupils are equal, round, and reactive to light. Right eye exhibits no discharge. Left eye exhibits no discharge. No scleral icterus.  Neck: Normal range of motion and phonation normal. Neck supple. No spinous process tenderness and no muscular tenderness present. No neck rigidity. Normal range of motion present.  Cardiovascular: Normal rate, regular rhythm, normal heart sounds and intact distal pulses.   No murmur heard. Pulmonary/Chest: Effort normal and breath sounds normal. No stridor. No respiratory distress. He has no wheezes. He has no rales.  Abdominal: Soft. Bowel sounds are normal. He exhibits no distension. There is no tenderness. There is no rigidity, no rebound and no guarding.  Musculoskeletal: Normal range of motion.  Lymphadenopathy:    He has no cervical adenopathy.  Neurological: He is alert. He is not disoriented. Coordination and gait normal. GCS eye subscore is 4. GCS verbal subscore is 5. GCS motor subscore is 6.  Skin: Skin is warm and dry. Abrasion noted. He is not diaphoretic.     Psychiatric: He has a normal mood and affect. His behavior is normal. Judgment normal.  Nursing note and vitals reviewed.    ED Treatments / Results  DIAGNOSTIC STUDIES:  Oxygen Saturation is 94% on RA,  adequate by my interpretation.    COORDINATION OF CARE:  5:17 PM Discussed treatment plan with pt at bedside and pt agreed to plan.  Labs (all labs ordered are listed, but only abnormal results are displayed) Labs Reviewed - No data to display  EKG  EKG Interpretation None       Radiology No results found.  Procedures Procedures (including critical care time)  Medications Ordered in ED Medications  acetaminophen (TYLENOL) tablet 650 mg (650 mg Oral Given 02/14/16 1839)     Initial Impression / Assessment and Plan / ED Course  I have reviewed the triage vital signs and the nursing notes.  Pertinent labs & imaging results that were available during my care of the patient were reviewed by me and considered in my medical decision making (see chart for details).  Clinical Course    Patient presents to ED for cat scratch and facial swelling. Patient is afebrile and non-toxic appearing in NAD. Vital signs remarkable for mildly elevated BP,  otherwise stable. Physical exam remarkable for well healing superficial scratches on left lower extremity. No evidence of puncture wounds. No evidence of infection. Patient highly concerned about possible rabies or bacterial infection from cat scratch. Per cat owners, cat is UTD on vaccines, indoor cat, and no known rabies exposure. No signs of infection at site  or cat scratch. Patient is afebrile and non-toxic appearing. Low suspicion for rabies. Low suspicion for systemic infection. Do not think PO ABX warranted at this time. Discussed with patient risks of unnecessary ABX use. Encouraged continued symptomatic management of scratch with hygiene and ABX ointment. Discussed signs of infection and indications for return. Patient voiced understanding and is agreeable.   Left sided facial swelling noted. No trauma noted. No erythema, warmth, induration, or discharge noted. No oral cavity swelling, sore throat, or pain. Patient is managing oral  secretions. No trismus. No sublingual swelling. No stridor. Phonation is normal. No neck pain or rigidity. Neck ROM intact. No SOB. Low suspicion for deep space infection. Unclear etiology. Given patient is managing oral secretions and airway intact, can be evaluated OP. Discussed conservative management with tylenol/motrin and icing. If swelling persists follow up with PCP in 3-5 days for further evaluation, resources provided. Return precautions discussed. Patient voiced understanding and is agreeable.   I personally performed the services described in this documentation, which was scribed in my presence. The recorded information has been reviewed and is accurate.  Final Clinical Impressions(s) / ED Diagnoses   Final diagnoses:  Cat scratch  Facial swelling    New Prescriptions Discharge Medication List as of 02/14/2016  6:33 PM        Lona KettleAshley Laurel Esvin Hnat, PA-C 02/16/16 1007    Benjiman CoreNathan Pickering, MD 02/16/16 2156

## 2017-10-15 ENCOUNTER — Emergency Department (HOSPITAL_COMMUNITY)
Admission: EM | Admit: 2017-10-15 | Discharge: 2017-10-15 | Disposition: A | Discharge status: Home / Self Care | Payer: Self-pay | Attending: Emergency Medicine | Admitting: Emergency Medicine

## 2017-10-15 ENCOUNTER — Other Ambulatory Visit: Payer: Self-pay

## 2017-10-15 ENCOUNTER — Encounter (HOSPITAL_COMMUNITY): Payer: Self-pay | Admitting: *Deleted

## 2017-10-15 DIAGNOSIS — Z79899 Other long term (current) drug therapy: Secondary | ICD-10-CM | POA: Insufficient documentation

## 2017-10-15 DIAGNOSIS — F17210 Nicotine dependence, cigarettes, uncomplicated: Secondary | ICD-10-CM | POA: Insufficient documentation

## 2017-10-15 DIAGNOSIS — R1013 Epigastric pain: Secondary | ICD-10-CM | POA: Insufficient documentation

## 2017-10-15 LAB — COMPREHENSIVE METABOLIC PANEL
ALT: 20 U/L (ref 17–63)
ANION GAP: 9 (ref 5–15)
AST: 22 U/L (ref 15–41)
Albumin: 3.9 g/dL (ref 3.5–5.0)
Alkaline Phosphatase: 82 U/L (ref 38–126)
BUN: 6 mg/dL (ref 6–20)
CHLORIDE: 105 mmol/L (ref 101–111)
CO2: 23 mmol/L (ref 22–32)
Calcium: 8.6 mg/dL — ABNORMAL LOW (ref 8.9–10.3)
Creatinine, Ser: 1 mg/dL (ref 0.61–1.24)
GFR calc non Af Amer: >60 mL/min (ref >60)
Glucose, Bld: 117 mg/dL — ABNORMAL HIGH (ref 65–99)
POTASSIUM: 3.7 mmol/L (ref 3.5–5.1)
SODIUM: 137 mmol/L (ref 135–145)
Total Bilirubin: 0.9 mg/dL (ref 0.3–1.2)
Total Protein: 7 g/dL (ref 6.5–8.1)

## 2017-10-15 LAB — CBC
HCT: 45.8 % (ref 39.0–52.0)
HEMOGLOBIN: 16.6 g/dL (ref 13.0–17.0)
MCH: 34.4 pg — AB (ref 26.0–34.0)
MCHC: 36.2 g/dL — ABNORMAL HIGH (ref 30.0–36.0)
MCV: 94.8 fL (ref 78.0–100.0)
Platelets: 210 10*3/uL (ref 150–400)
RBC: 4.83 MIL/uL (ref 4.22–5.81)
RDW: 12.6 % (ref 11.5–15.5)
WBC: 3.4 10*3/uL — ABNORMAL LOW (ref 4.0–10.5)

## 2017-10-15 LAB — URINALYSIS, ROUTINE W REFLEX MICROSCOPIC
Bilirubin Urine: NEGATIVE
Glucose, UA: NEGATIVE mg/dL
Ketones, ur: NEGATIVE mg/dL
Leukocytes, UA: NEGATIVE
Nitrite: NEGATIVE
PROTEIN: NEGATIVE mg/dL
SPECIFIC GRAVITY, URINE: 1.006 (ref 1.005–1.030)
pH: 5 (ref 5.0–8.0)

## 2017-10-15 LAB — LIPASE, BLOOD: LIPASE: 75 U/L — AB (ref 11–51)

## 2017-10-15 MED ORDER — DICYCLOMINE HCL 20 MG PO TABS: 20.0000 mg | ORAL_TABLET | Freq: Two times a day (BID) | ORAL | 0 refills | Status: DC

## 2017-10-15 MED ORDER — SUCRALFATE 1 G PO TABS: 1.0000 g | ORAL_TABLET | Freq: Three times a day (TID) | ORAL | 0 refills | Status: DC

## 2017-10-15 MED ORDER — GI COCKTAIL ~~LOC~~
30.0000 mL | Freq: Once | ORAL | Status: AC
  Administered 2017-10-15: 30 mL via ORAL
  Filled 2017-10-15: qty 30

## 2017-10-15 MED ORDER — FAMOTIDINE 20 MG PO TABS: 20.0000 mg | ORAL_TABLET | Freq: Two times a day (BID) | ORAL | 0 refills | Status: DC

## 2017-10-15 NOTE — ED Triage Notes (Signed)
The pt is c/o abd pain  For 3 days no nausea vomiting or diarrhea

## 2017-10-15 NOTE — Discharge Instructions (Addendum)

## 2017-10-15 NOTE — ED Provider Notes (Signed)
[facility] EMERGENCY DEPARTMENT Provider Note   CSN: [id number] Arrival date & time: 10/15/17  8295     History   Chief Complaint Chief Complaint  Patient presents with  . Abdominal Pain    HPI Scott Vance is a 52 y.o. male resents to the emergency department chief complaint of epigastric abdominal pain.  Patient states that the pain began about 3 days ago nothing seems to make it worse or better.  At times it radiates to his back.  He states that it is also in the right upper quadrant.  He is never had any pain like this before.  He has had some associated reflux symptoms.  He took ibuprofen, Tylenol, acid reducing medication x2 without any relief of his symptoms.  He denies nausea vomiting constipation diarrhea.  He did try a laxative to see if his abdominal pain would improve.  He is a daily smoker but he denies regular alcohol use, regular NSAID use.  He denies previous abdominal surgeries.  He denies chest pain or shortness of breath.  HPI  History reviewed. No pertinent past medical history.  There are no active problems to display for this patient.   History reviewed. No pertinent surgical history.      Home Medications    Prior to Admission medications   Medication Sig Start Date End Date Taking? Authorizing Provider  bismuth subsalicylate (PEPTO BISMOL) 262 MG/15ML suspension Take 30 mLs by mouth every 6 (six) hours as needed (stomach indigestion).    [provider]  famotidine (PEPCID) 20 MG tablet Take 1 tablet (20 mg total) by mouth 2 (two) times daily. 04/15/14   Kirichenko, Tatyana, PA-C  fluticasone (FLONASE) 50 MCG/ACT nasal spray Place 2 sprays into both nostrils daily. 06/17/15   Garlon Hatchet, PA-C  GuaiFENesin (MUCUS RELIEF ADULT PO) Take 2 tablets by mouth daily as needed (cold symptoms).    [provider]  HYDROcodone-acetaminophen (NORCO/VICODIN) 5-325 MG per tablet Take 1 tablet by mouth every 4 (four) hours as  needed for moderate pain or severe pain. 04/15/14   Kirichenko, Lemont Fillers, PA-C  sodium-potassium bicarbonate (ALKA-SELTZER GOLD) TBEF dissolvable tablet Take 1 tablet by mouth daily as needed (indigestion).    [provider]    Family History No family history on file.  Social History Social History   Tobacco Use  . Smoking status: Current Some Day Smoker    Types: Cigarettes  . Smokeless tobacco: Never Used  Substance Use Topics  . Alcohol use: No  . Drug use: No     Allergies   Patient has no known allergies.   Review of Systems Review of Systems  Ten systems reviewed and are negative for acute change, except as noted in the HPI.   Physical Exam Updated Vital Signs BP (!) 144/99   Pulse 68   Temp 98.8 F (37.1 C) (Oral)   Resp 18   Ht  (1.854 m)   Wt 102.1 kg (225 lb)   SpO2 100%   BMI 29.69 kg/m   Physical Exam  Constitutional: He appears well-developed and well-nourished. No distress.  HENT:  Head: Normocephalic and atraumatic.  Eyes: Conjunctivae are normal. No scleral icterus.  Neck: Normal range of motion. Neck supple.  Cardiovascular: Normal rate, regular rhythm and normal heart sounds.  Pulmonary/Chest: Effort normal and breath sounds normal. No respiratory distress.  Abdominal: Soft. Bowel sounds are normal. There is no tenderness. There is no rigidity, no rebound, no guarding and  no CVA tenderness.  Musculoskeletal: He exhibits no edema.  Neurological: He is alert.  Skin: Skin is warm and dry. He is not diaphoretic.  Psychiatric: His behavior is normal.  Nursing note and vitals reviewed.    ED Treatments / Results  Labs (all labs ordered are listed, but only abnormal results are displayed) Labs Reviewed  LIPASE, BLOOD - Abnormal; Notable for the following components:      Result Value   Lipase 75 (*)    All other components within normal limits  COMPREHENSIVE METABOLIC PANEL - Abnormal; Notable for the following components:     Glucose, Bld 117 (*)    Calcium 8.6 (*)    All other components within normal limits  CBC - Abnormal; Notable for the following components:   WBC 3.4 (*)    MCH 34.4 (*)    MCHC 36.2 (*)    All other components within normal limits  URINALYSIS, ROUTINE W REFLEX MICROSCOPIC - Abnormal; Notable for the following components:   Hgb urine dipstick SMALL (*)    Bacteria, UA RARE (*)    All other components within normal limits    EKG None  Radiology No results found.  Procedures Procedures (including critical care time)  Medications Ordered in ED Medications  gi cocktail (Maalox,Lidocaine,Donnatal) (has no administration in time range)     Initial Impression / Assessment and Plan / ED Course  I have reviewed the triage vital signs and the nursing notes.  Pertinent labs & imaging results that were available during my care of the patient were reviewed by me and considered in my medical decision making (see chart for details).     With epigastric abdominal pain.  Seen for similar evaluation about 4 years ago.  He denies alcohol abuse.  Differential diagnosis includes peptic ulcer disease, gastritis, biliary colic although less secondary to the fact that there is no evidence of liver enzyme evaluation and no Murphy sign.  His pancreatic enzymes are slightly elevated but I doubt acute pancreatitis.  Has had no active vomiting here and a benign abdominal exam.  He appears appropriate for discharge at this time.  Final Clinical Impressions(s) / ED Diagnoses   Final diagnoses:  Epigastric pain    ED Discharge Orders    None       Arthor Captain, PA-C 10/15/17 1614    Shaune Pollack, MD 10/17/17 1327

## 2018-01-11 ENCOUNTER — Emergency Department (HOSPITAL_COMMUNITY): Payer: No Typology Code available for payment source

## 2018-01-11 ENCOUNTER — Encounter (HOSPITAL_COMMUNITY): Payer: Self-pay | Admitting: Family Medicine

## 2018-01-11 ENCOUNTER — Emergency Department (HOSPITAL_COMMUNITY)
Admission: EM | Admit: 2018-01-11 | Discharge: 2018-01-12 | Disposition: A | Discharge status: Home / Self Care | Payer: No Typology Code available for payment source | Attending: Emergency Medicine | Admitting: Emergency Medicine

## 2018-01-11 DIAGNOSIS — M79622 Pain in left upper arm: Secondary | ICD-10-CM

## 2018-01-11 DIAGNOSIS — Y939 Activity, unspecified: Secondary | ICD-10-CM | POA: Diagnosis not present

## 2018-01-11 DIAGNOSIS — S199XXA Unspecified injury of neck, initial encounter: Secondary | ICD-10-CM | POA: Diagnosis present

## 2018-01-11 DIAGNOSIS — Y999 Unspecified external cause status: Secondary | ICD-10-CM | POA: Insufficient documentation

## 2018-01-11 DIAGNOSIS — S161XXA Strain of muscle, fascia and tendon at neck level, initial encounter: Secondary | ICD-10-CM

## 2018-01-11 DIAGNOSIS — Z79899 Other long term (current) drug therapy: Secondary | ICD-10-CM | POA: Diagnosis not present

## 2018-01-11 DIAGNOSIS — F17210 Nicotine dependence, cigarettes, uncomplicated: Secondary | ICD-10-CM | POA: Diagnosis not present

## 2018-01-11 DIAGNOSIS — Y92410 Unspecified street and highway as the place of occurrence of the external cause: Secondary | ICD-10-CM | POA: Diagnosis not present

## 2018-01-11 MED ORDER — OXYCODONE-ACETAMINOPHEN 5-325 MG PO TABS
1.0000 | ORAL_TABLET | Freq: Once | ORAL | Status: AC
  Administered 2018-01-11: 1 via ORAL
  Filled 2018-01-11: qty 1

## 2018-01-11 MED ORDER — CYCLOBENZAPRINE HCL 10 MG PO TABS
5.0000 mg | ORAL_TABLET | Freq: Once | ORAL | Status: AC
  Administered 2018-01-11: 5 mg via ORAL
  Filled 2018-01-11: qty 1

## 2018-01-11 MED ORDER — IBUPROFEN 200 MG PO TABS
600.0000 mg | ORAL_TABLET | Freq: Once | ORAL | Status: AC
  Administered 2018-01-11: 600 mg via ORAL
  Filled 2018-01-11: qty 3

## 2018-01-11 NOTE — ED Notes (Signed)
Patient transported to X-ray 
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
## 2018-01-12 ENCOUNTER — Emergency Department (HOSPITAL_COMMUNITY): Payer: No Typology Code available for payment source

## 2018-01-12 ENCOUNTER — Other Ambulatory Visit: Payer: Self-pay

## 2018-01-12 MED ORDER — CYCLOBENZAPRINE HCL 10 MG PO TABS: 10.0000 mg | ORAL_TABLET | Freq: Three times a day (TID) | ORAL | 0 refills | Status: DC | PRN

## 2018-01-12 MED ORDER — IBUPROFEN 600 MG PO TABS: 600.0000 mg | ORAL_TABLET | Freq: Three times a day (TID) | ORAL | 0 refills | Status: DC | PRN
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
## 2018-07-31 ENCOUNTER — Emergency Department (HOSPITAL_COMMUNITY): Payer: Self-pay

## 2018-07-31 ENCOUNTER — Emergency Department (HOSPITAL_COMMUNITY)
Admission: EM | Admit: 2018-07-31 | Discharge: 2018-07-31 | Disposition: A | Discharge status: Home / Self Care | Payer: Self-pay | Attending: Emergency Medicine | Admitting: Emergency Medicine

## 2018-07-31 ENCOUNTER — Encounter (HOSPITAL_COMMUNITY): Payer: Self-pay

## 2018-07-31 DIAGNOSIS — R69 Illness, unspecified: Secondary | ICD-10-CM

## 2018-07-31 DIAGNOSIS — J189 Pneumonia, unspecified organism: Secondary | ICD-10-CM | POA: Insufficient documentation

## 2018-07-31 DIAGNOSIS — J111 Influenza due to unidentified influenza virus with other respiratory manifestations: Secondary | ICD-10-CM | POA: Insufficient documentation

## 2018-07-31 DIAGNOSIS — Z79899 Other long term (current) drug therapy: Secondary | ICD-10-CM | POA: Insufficient documentation

## 2018-07-31 DIAGNOSIS — F17210 Nicotine dependence, cigarettes, uncomplicated: Secondary | ICD-10-CM | POA: Insufficient documentation

## 2018-07-31 MED ORDER — BENZONATATE 200 MG PO CAPS: 200.0000 mg | ORAL_CAPSULE | Freq: Three times a day (TID) | ORAL | 0 refills | Status: AC | PRN

## 2018-07-31 MED ORDER — AMOXICILLIN-POT CLAVULANATE ER 1000-62.5 MG PO TB12: 2.0000 | ORAL_TABLET | Freq: Two times a day (BID) | ORAL | 0 refills | Status: DC

## 2018-07-31 MED ORDER — AMOXICILLIN-POT CLAVULANATE 875-125 MG PO TABS: 2.0000 | ORAL_TABLET | Freq: Two times a day (BID) | ORAL | 0 refills | Status: AC

## 2018-07-31 MED ORDER — OSELTAMIVIR PHOSPHATE 75 MG PO CAPS: 75.0000 mg | ORAL_CAPSULE | Freq: Two times a day (BID) | ORAL | 0 refills | Status: DC

## 2018-07-31 MED ORDER — AZITHROMYCIN 250 MG PO TABS: 250.0000 mg | ORAL_TABLET | Freq: Every day | ORAL | 0 refills | Status: DC

## 2018-07-31 NOTE — ED Triage Notes (Addendum)
Patient c/o chest congestion, dry cough, sweats, chills, and ear congestion. C/O shob  Patient able to speak in complete sentences with out any problems in triage.   Denies n/v or diarrhea. Ambulatory in triage.    Patient has taken tylenol.

## 2018-07-31 NOTE — ED Notes (Signed)
Pt c/o decrease appetite and not been able to smoke as his usual

## 2018-07-31 NOTE — ED Provider Notes (Addendum)
[facility]-EMERGENCY DEPT Provider Note   CSN: [id number] Arrival date & time: 07/31/18  1118     History   Chief Complaint Chief Complaint  Patient presents with  . URI    HPI Scott Vance is a 53 y.o. male.  53yo male presents with complaint of cough, congestion, body aches, chills, sweats, ear pressure x 3 days, also decrease in appetite. Patient reports exposure to a neighbor who is sick as well at taking his daughter to the ER last week and spending a prolonged time in the lobby. Denies recent travel to [place] or contact with someone who has recently traveled to [place]. Does not vape but does smoke 1 PPD. No history of asthma or chronic lung disease. Patient took generic Mucinex without any relief.  Patient has taken Tylenol.  No other complaints or concerns.     History reviewed. No pertinent past medical history.  There are no active problems to display for this patient.   History reviewed. No pertinent surgical history.      Home Medications    Prior to Admission medications   Medication Sig Start Date End Date Taking? Authorizing Provider  amoxicillin-clavulanate (AUGMENTIN) 875-125 MG tablet Take 2 tablets by mouth every 12 (twelve) hours for 10 days. 07/31/18 08/10/18  Jeannie Fend, PA-C  azithromycin (ZITHROMAX) 250 MG tablet Take 1 tablet (250 mg total) by mouth daily. Take first 2 tablets together, then 1 every day until finished. 07/31/18   Jeannie Fend, PA-C  benzonatate (TESSALON) 200 MG capsule Take 1 capsule (200 mg total) by mouth 3 (three) times daily as needed for up to 10 days for cough. 07/31/18 08/10/18  Jeannie Fend, PA-C  bismuth subsalicylate (PEPTO BISMOL) 262 MG/15ML suspension Take 30 mLs by mouth every 6 (six) hours as needed (stomach indigestion).    [provider]  cyclobenzaprine (FLEXERIL) 10 MG tablet Take 1 tablet (10 mg total) by mouth 3 (three) times daily as needed for muscle spasms. 01/12/18    Azalia Bilis, MD  dicyclomine (BENTYL) 20 MG tablet Take 1 tablet (20 mg total) by mouth 2 (two) times daily. 10/15/17   Arthor Captain, PA-C  famotidine (PEPCID) 20 MG tablet Take 1 tablet (20 mg total) by mouth 2 (two) times daily. 10/15/17   Harris, Abigail, PA-C  fluticasone (FLONASE) 50 MCG/ACT nasal spray Place 2 sprays into both nostrils daily. 06/17/15   Garlon Hatchet, PA-C  GuaiFENesin (MUCUS RELIEF ADULT PO) Take 2 tablets by mouth daily as needed (cold symptoms).    [provider]  HYDROcodone-acetaminophen (NORCO/VICODIN) 5-325 MG per tablet Take 1 tablet by mouth every 4 (four) hours as needed for moderate pain or severe pain. 04/15/14   Kirichenko, Tatyana, PA-C  ibuprofen (ADVIL,MOTRIN) 600 MG tablet Take 1 tablet (600 mg total) by mouth every 8 (eight) hours as needed. 01/12/18   Azalia Bilis, MD  oseltamivir (TAMIFLU) 75 MG capsule Take 1 capsule (75 mg total) by mouth every 12 (twelve) hours. 07/31/18   Jeannie Fend, PA-C  sodium-potassium bicarbonate (ALKA-SELTZER GOLD) TBEF dissolvable tablet Take 1 tablet by mouth daily as needed (indigestion).    [provider]  sucralfate (CARAFATE) 1 g tablet Take 1 tablet (1 g total) by mouth 3 (three) times daily. 10/15/17   Arthor Captain, PA-C    Family History History reviewed. No pertinent family history.  Social History Social History   Tobacco Use  . Smoking status: Current Some Day Smoker  Types: Cigarettes  . Smokeless tobacco: Never Used  Substance Use Topics  . Alcohol use: No  . Drug use: No     Allergies   Patient has no known allergies.   Review of Systems Review of Systems  Constitutional: Positive for appetite change, chills, diaphoresis and fever.  HENT: Positive for congestion and ear pain. Negative for sinus pressure, sinus pain, sneezing and sore throat.   Respiratory: Positive for cough. Negative for shortness of breath and wheezing.   Cardiovascular: Negative for chest pain.   Gastrointestinal: Negative for abdominal pain, constipation, diarrhea, nausea and vomiting.  Musculoskeletal: Positive for arthralgias and myalgias.  Skin: Negative for rash and wound.  Allergic/Immunologic: Negative for immunocompromised state.  Neurological: Negative for weakness and headaches.  Hematological: Negative for adenopathy.  All other systems reviewed and are negative.    Physical Exam Updated Vital Signs BP (!) 153/104   Pulse 99   Temp (!) 100.4 F (38 C) (Oral)   Resp 20   SpO2 99%   Physical Exam Vitals signs and nursing note reviewed.  Constitutional:      General: He is not in acute distress.    Appearance: He is well-developed. He is not ill-appearing or toxic-appearing.  HENT:     Head: Normocephalic and atraumatic.     Right Ear: Hearing, tympanic membrane, ear canal and external ear normal. No middle ear effusion.     Left Ear: Hearing, tympanic membrane, ear canal and external ear normal.  No middle ear effusion.     Nose: Congestion present. No rhinorrhea.     Mouth/Throat:     Pharynx: Uvula midline. Posterior oropharyngeal erythema present. No oropharyngeal exudate or uvula swelling.     Tonsils: No tonsillar exudate or tonsillar abscesses.  Eyes:     Pupils: Pupils are equal, round, and reactive to light.  Neck:     Musculoskeletal: Neck supple.  Cardiovascular:     Rate and Rhythm: Normal rate.     Heart sounds: Normal heart sounds. No murmur.  Pulmonary:     Effort: Pulmonary effort is normal.     Breath sounds: Normal breath sounds. No wheezing.  Lymphadenopathy:     Cervical: No cervical adenopathy.  Skin:    General: Skin is warm and dry.     Findings: No rash.  Neurological:     Mental Status: He is alert and oriented to person, place, and time.  Psychiatric:        Behavior: Behavior normal.      ED Treatments / Results  Labs (all labs ordered are listed, but only abnormal results are displayed) Labs Reviewed - No data to  display  EKG None  Radiology Dg Chest 2 View  Result Date: 07/31/2018 CLINICAL DATA:  Cough and congestion and body aches, fever 3-4 days. EXAM: CHEST - 2 VIEW COMPARISON:  04/24/2013 FINDINGS: Subtle density over the anterior right middle lobe/lingula on the lateral film which may be due to atelectasis versus early infection. No evidence of effusion. Cardiomediastinal silhouette and remainder the exam is unremarkable. IMPRESSION: Subtle opacification over the anterior right middle lobe/lingula on the lateral film which may be due to atelectasis versus early infection. Electronically Signed   By: Elberta Fortisaniel  Boyle M.D.   On: 07/31/2018 13:05    Procedures Procedures (including critical care time)  Medications Ordered in ED Medications - No data to display   Initial Impression / Assessment and Plan / ED Course  I have reviewed the triage vital signs  and the nursing notes.  Pertinent labs & imaging results that were available during my care of the patient were reviewed by me and considered in my medical decision making (see chart for details).  Clinical Course as of Aug 01 1603  Mon Jul 31, 2018  [id number] 53 year old male presents with flulike symptoms x3 days.  Patient is in daily smoker without history of chronic lung disease.  On exam patient is well-appearing with frequent nonproductive cough.  Chest x-ray with possible right lingular pneumonia.  Patient is febrile with temp of 100.4 in the emergency room.  Per up-to-date, patient will be treated with Zithromax and Augmentin.  Patient is unable to afford the recommended 2 g twice daily dose of Augmentin, Rx given for 2 875 tablets twice daily.  Also given prescription for Tamiflu, advised he is on the edge of the 3-day limit for starting this medication, discussed side effects.  Recommend Coricidin HBP for symptom relief with history of high blood pressure.  Also recommend recheck with PCP in 2 days with repeat chest x-ray in 6 weeks to ensure  resolution of the pneumonia.  Patient verbalizes understanding of discharge instructions and plan.   [LM]  1602 Call from [facility] regarding Augmentin rx, [facility] pharmacist had recommended 875/125 2 tablets PO BID, [facility] pharmacist is concerned this is too much Clavulate. Discussed with hospital pharmacist, review of [facility] guide, recommends Augmentin 875/125 1 tablet PO BID.   [LM]    Clinical Course User Index [LM] Jeannie Fend, PA-C   Final Clinical Impressions(s) / ED Diagnoses   Final diagnoses:  Influenza-like illness  Community acquired pneumonia, unspecified laterality    ED Discharge Orders         Ordered    benzonatate (TESSALON) 200 MG capsule  3 times daily PRN     07/31/18 1308    amoxicillin-clavulanate (AUGMENTIN XR) 1000-62.5 MG 12 hr tablet  2 times daily,   Status:  Discontinued     07/31/18 1311    azithromycin (ZITHROMAX) 250 MG tablet  Daily     07/31/18 1311    amoxicillin-clavulanate (AUGMENTIN) 875-125 MG tablet  Every 12 hours     07/31/18 1323    oseltamivir (TAMIFLU) 75 MG capsule  Every 12 hours     07/31/18 1336           Jeannie Fend, PA-C 07/31/18 1338    Jeannie Fend, PA-C 07/31/18 1605    Charlynne Pander, MD 08/02/18 1501

## 2018-07-31 NOTE — ED Notes (Signed)
Pt coughing up yellow phelm and has been running a fever x3 days, coughing, he has taken mucinex OTC w/ little relief

## 2018-07-31 NOTE — Discharge Instructions (Addendum)
Follow up with your doctor, return to ER for severe or concerning symptoms. Home to rest, push hydrating fluids (G2). Motrin/Tylenol as needed as directed for fever and body aches. Coricidin HBP for symptom relief- this is safe for people with high blood pressure. Tessalon as needed as prescribed for cough.  Take the antibiotics (Augmentin and Zithromax) as prescribed and complete the full course. Recheck with PCP in 2 days, repeat chest x-ray in 6 weeks to make sure pneumonia has cleared.

## 2018-12-14 ENCOUNTER — Other Ambulatory Visit: Payer: Self-pay

## 2018-12-14 ENCOUNTER — Emergency Department (HOSPITAL_COMMUNITY)
Admission: EM | Admit: 2018-12-14 | Discharge: 2018-12-14 | Disposition: A | Discharge status: Home / Self Care | Payer: Self-pay | Attending: Emergency Medicine | Admitting: Emergency Medicine

## 2018-12-14 ENCOUNTER — Emergency Department (HOSPITAL_COMMUNITY): Payer: Self-pay

## 2018-12-14 ENCOUNTER — Encounter (HOSPITAL_COMMUNITY): Payer: Self-pay

## 2018-12-14 DIAGNOSIS — K859 Acute pancreatitis without necrosis or infection, unspecified: Secondary | ICD-10-CM | POA: Insufficient documentation

## 2018-12-14 DIAGNOSIS — Z79899 Other long term (current) drug therapy: Secondary | ICD-10-CM | POA: Insufficient documentation

## 2018-12-14 DIAGNOSIS — F17210 Nicotine dependence, cigarettes, uncomplicated: Secondary | ICD-10-CM | POA: Insufficient documentation

## 2018-12-14 DIAGNOSIS — R1084 Generalized abdominal pain: Secondary | ICD-10-CM | POA: Insufficient documentation

## 2018-12-14 LAB — CBC
HCT: 49 % (ref 39.0–52.0)
Hemoglobin: 16.7 g/dL (ref 13.0–17.0)
MCH: 32.9 pg (ref 26.0–34.0)
MCHC: 34.1 g/dL (ref 30.0–36.0)
MCV: 96.6 fL (ref 80.0–100.0)
Platelets: 196 10*3/uL (ref 150–400)
RBC: 5.07 MIL/uL (ref 4.22–5.81)
RDW: 12.8 % (ref 11.5–15.5)
WBC: 4.2 10*3/uL (ref 4.0–10.5)
nRBC: 0 % (ref 0.0–0.2)

## 2018-12-14 LAB — LIPASE, BLOOD: Lipase: 211 U/L — ABNORMAL HIGH (ref 11–51)

## 2018-12-14 LAB — COMPREHENSIVE METABOLIC PANEL
ALT: 21 U/L (ref 0–44)
AST: 22 U/L (ref 15–41)
Albumin: 4 g/dL (ref 3.5–5.0)
Alkaline Phosphatase: 85 U/L (ref 38–126)
Anion gap: 7 (ref 5–15)
BUN: 12 mg/dL (ref 6–20)
CO2: 25 mmol/L (ref 22–32)
Calcium: 8.6 mg/dL — ABNORMAL LOW (ref 8.9–10.3)
Chloride: 108 mmol/L (ref 98–111)
Creatinine, Ser: 1 mg/dL (ref 0.61–1.24)
GFR calc Af Amer: >60 mL/min (ref >60)
GFR calc non Af Amer: >60 mL/min (ref >60)
Glucose, Bld: 98 mg/dL (ref 70–99)
Potassium: 4 mmol/L (ref 3.5–5.1)
Sodium: 140 mmol/L (ref 135–145)
Total Bilirubin: 0.7 mg/dL (ref 0.3–1.2)
Total Protein: 7.2 g/dL (ref 6.5–8.1)

## 2018-12-14 LAB — TROPONIN I (HIGH SENSITIVITY): Troponin I (High Sensitivity): 8 ng/L (ref <18)

## 2018-12-14 MED ORDER — HYDROCODONE-ACETAMINOPHEN 5-325 MG PO TABS: 1.0000 | ORAL_TABLET | Freq: Four times a day (QID) | ORAL | 0 refills | Status: DC | PRN

## 2018-12-14 MED ORDER — IOHEXOL 300 MG/ML  SOLN
100.0000 mL | Freq: Once | INTRAMUSCULAR | Status: AC | PRN
  Administered 2018-12-14: 12:00:00 100 mL via INTRAVENOUS

## 2018-12-14 MED ORDER — SODIUM CHLORIDE (PF) 0.9 % IJ SOLN
INTRAMUSCULAR | Status: AC
  Filled 2018-12-14: qty 50

## 2018-12-14 MED ORDER — LIDOCAINE VISCOUS HCL 2 % MT SOLN
15.0000 mL | Freq: Once | OROMUCOSAL | Status: AC
  Administered 2018-12-14: 15 mL via ORAL
  Filled 2018-12-14: qty 15

## 2018-12-14 MED ORDER — SODIUM CHLORIDE 0.9% FLUSH
3.0000 mL | Freq: Once | INTRAVENOUS | Status: AC
  Administered 2018-12-14: 3 mL via INTRAVENOUS

## 2018-12-14 MED ORDER — ALUM & MAG HYDROXIDE-SIMETH 200-200-20 MG/5ML PO SUSP
30.0000 mL | Freq: Once | ORAL | Status: AC
  Administered 2018-12-14: 14:00:00 30 mL via ORAL
  Filled 2018-12-14: qty 30

## 2018-12-14 MED ORDER — HYDROCODONE-ACETAMINOPHEN 5-325 MG PO TABS
1.0000 | ORAL_TABLET | Freq: Once | ORAL | Status: AC
  Administered 2018-12-14: 14:00:00 1 via ORAL
  Filled 2018-12-14: qty 1

## 2018-12-14 NOTE — ED Provider Notes (Signed)
[facility]   CSN: [id number] Arrival date & time: 12/14/18  0907    History   Chief Complaint Chief Complaint  Patient presents with  . Abdominal Pain    HPI Scott Vance is a 53 y.o. male with a past medical history of diverticulosis, who presents today for evaluation of abdominal and chest pain.  He reports that his abdominal pain started 6 days ago and has been gradually worsening.  His pain is not in one specific spot however it more or less moves around.  His pain is waxing and waning in nature but always present.  He denies nausea, vomiting, or diarrhea.  His last bowel movement was approximately 3 days ago and was normal for him.  He denies any recent trauma.  He states that for approximately 1 month he has had worsening pain in his upper back.  He denies any recent cough or shortness of breath.  No known sick contacts.  No fevers at home.  He denies any dysuria, increased frequency or urgency.    He reports that when he was here in 2015 for similar symptoms he was given a GI cocktail with significant relief.     HPI  History reviewed. No pertinent past medical history.  There are no active problems to display for this patient.   History reviewed. No pertinent surgical history.      Home Medications    Prior to Admission medications   Medication Sig Start Date End Date Taking? Authorizing Provider  bismuth subsalicylate (PEPTO BISMOL) 262 MG/15ML suspension Take 30 mLs by mouth every 6 (six) hours as needed (stomach indigestion).   Yes [provider]  ibuprofen (ADVIL) 200 MG tablet Take 200 mg by mouth every 6 (six) hours as needed for mild pain.   Yes [provider]  simethicone (MYLICON) 80 MG chewable tablet Chew 80 mg by mouth every 6 (six) hours as needed for flatulence.   Yes [provider]  sodium-potassium bicarbonate (ALKA-SELTZER GOLD) TBEF dissolvable tablet Take 1 tablet by  mouth daily as needed (indigestion).   Yes [provider]  azithromycin (ZITHROMAX) 250 MG tablet Take 1 tablet (250 mg total) by mouth daily. Take first 2 tablets together, then 1 every day until finished. Patient not taking: Reported on 12/14/2018 07/31/18   Army MeliaMurphy, Laura A, PA-C  cyclobenzaprine (FLEXERIL) 10 MG tablet Take 1 tablet (10 mg total) by mouth 3 (three) times daily as needed for muscle spasms. Patient not taking: Reported on 12/14/2018 01/12/18   Azalia Bilisampos, Kevin, MD  dicyclomine (BENTYL) 20 MG tablet Take 1 tablet (20 mg total) by mouth 2 (two) times daily. Patient not taking: Reported on 12/14/2018 10/15/17   Arthor CaptainHarris, Abigail, PA-C  famotidine (PEPCID) 20 MG tablet Take 1 tablet (20 mg total) by mouth 2 (two) times daily. Patient not taking: Reported on 12/14/2018 10/15/17   Arthor CaptainHarris, Abigail, PA-C  fluticasone [facility](FLONASE) 50 MCG/ACT nasal spray Place 2 sprays into both nostrils daily. Patient not taking: Reported on 12/14/2018 06/17/15   Garlon HatchetSanders, Lisa M, PA-C  HYDROcodone-acetaminophen (NORCO/VICODIN) 5-325 MG tablet Take 1 tablet by mouth every 6 (six) hours as needed. 12/14/18   Cristina GongHammond,  W, PA-C  ibuprofen (ADVIL,MOTRIN) 600 MG tablet Take 1 tablet (600 mg total) by mouth every 8 (eight) hours as needed. Patient not taking: Reported on 12/14/2018 01/12/18   Azalia Bilisampos, Kevin, MD  oseltamivir (TAMIFLU) 75 MG capsule Take 1 capsule (75 mg total) by mouth every 12 (  twelve) hours. Patient not taking: Reported on 12/14/2018 07/31/18   Suella Broad A, PA-C  sucralfate (CARAFATE) 1 g tablet Take 1 tablet (1 g total) by mouth 3 (three) times daily. Patient not taking: Reported on 12/14/2018 10/15/17   Margarita Mail, PA-C    Family History No family history on file.  Social History Social History   Tobacco Use  . Smoking status: Current Some Day Smoker    Types: Cigarettes  . Smokeless tobacco: Never Used  Substance Use Topics  . Alcohol use: No  . Drug use: No     Allergies    Patient has no known allergies.   Review of Systems Review of Systems  Constitutional: Negative for chills and fever.  HENT: Negative for congestion and sore throat.   Respiratory: Negative for chest tightness and shortness of breath.   Cardiovascular: Negative for chest pain, palpitations and leg swelling.  Gastrointestinal: Positive for abdominal pain. Negative for constipation, diarrhea and vomiting.  Genitourinary: Negative for dysuria, hematuria and urgency.  Musculoskeletal: Positive for back pain. Negative for neck pain.  Skin: Negative for color change and rash.  Neurological: Negative for dizziness, weakness and headaches.  Psychiatric/Behavioral: Negative for confusion.  All other systems reviewed and are negative.    Physical Exam Updated Vital Signs BP (!) 144/87 (BP Location: Left Arm)   Pulse 79   Temp 98.9 F (37.2 C) (Oral)   Resp 16   Ht 6' (1.829 m)   Wt 102.1 kg   SpO2 97%   BMI 30.52 kg/m   Physical Exam Vitals signs and nursing note reviewed.  Constitutional:      General: He is not in acute distress.    Appearance: He is well-developed.  HENT:     Head: Normocephalic and atraumatic.  Eyes:     Conjunctiva/sclera: Conjunctivae normal.  Neck:     Musculoskeletal: Neck supple.  Cardiovascular:     Rate and Rhythm: Normal rate and regular rhythm.     Heart sounds: No murmur.  Pulmonary:     Effort: Pulmonary effort is normal. No respiratory distress.     Breath sounds: Normal breath sounds.  Abdominal:     General: There is distension (very mild).     Palpations: Abdomen is soft.     Tenderness: There is generalized abdominal tenderness (Worse in epigastric, bilateral lower quadrants). There is no right CVA tenderness, left CVA tenderness, guarding or rebound.     Hernia: No hernia is present.  Musculoskeletal:     Comments: There is generalized TTP over the back in thoracic  paraspinal muscles L>R. Palpation here both recreates and  exacerbates his reported pain.   Skin:    General: Skin is warm and dry.  Neurological:     General: No focal deficit present.     Mental Status: He is alert.  Psychiatric:        Mood and Affect: Mood normal.        Behavior: Behavior normal.      ED Treatments / Results  Labs (all labs ordered are listed, but only abnormal results are displayed) Labs Reviewed  LIPASE, BLOOD - Abnormal; Notable for the following components:      Result Value   Lipase 211 (*)    All other components within normal limits  COMPREHENSIVE METABOLIC PANEL - Abnormal; Notable for the following components:   Calcium 8.6 (*)    All other components within normal limits  CBC  TROPONIN I (HIGH SENSITIVITY)  URINALYSIS, ROUTINE W REFLEX MICROSCOPIC    EKG EKG Interpretation  Date/Time:  Thursday December 14 2018 09:45:50 EDT Ventricular Rate:  88 PR Interval:    QRS Duration: 93 QT Interval:  361 QTC Calculation: 437 R Axis:   45 Text Interpretation:  Sinus rhythm Probable left atrial enlargement RSR' in V1 or V2, right VCD or RVH Abnormal T, consider ischemia, lateral leads Confirmed by Raeford RazorKohut, Stephen [phone]([id number]) on 12/14/2018 10:09:51 AM   Radiology Dg Chest 2 View  Result Date: 12/14/2018 CLINICAL DATA:  Chest and epigastric pain EXAM: CHEST - 2 VIEW COMPARISON:  July 31, 2018 FINDINGS: There is no edema or consolidation. Heart size and pulmonary vascularity are normal. No adenopathy. No pneumothorax. No bone lesions. IMPRESSION: No edema or consolidation. Electronically Signed   By: Bretta BangWilliam  Woodruff III M.D.   On: 12/14/2018 10:47   Ct Abdomen Pelvis W Contrast  Result Date: 12/14/2018 CLINICAL DATA:  Abdominal and back pain EXAM: CT ABDOMEN AND PELVIS WITH CONTRAST TECHNIQUE: Multidetector CT imaging of the abdomen and pelvis was performed using the standard protocol following bolus administration of intravenous contrast. CONTRAST:  100mL OMNIPAQUE IOHEXOL 300 MG/ML  SOLN COMPARISON:   04/15/2014 FINDINGS: Lower chest: No acute abnormality. Hepatobiliary: No solid liver abnormality is seen. No gallstones, gallbladder wall thickening, or biliary dilatation. Pancreas: There may be minimal fat stranding about the pancreatic head (series 2, image 29). No pancreatic ductal dilatation. Spleen: Normal in size without significant abnormality. Adrenals/Urinary Tract: Adrenal glands are unremarkable. Kidneys are normal, without renal calculi, solid lesion, or hydronephrosis. Bladder is unremarkable. Stomach/Bowel: Stomach is within normal limits. Appendix appears normal. No evidence of bowel wall thickening, distention, or inflammatory changes. Sigmoid diverticulosis. Vascular/Lymphatic: No significant vascular findings are present. No enlarged abdominal or pelvic lymph nodes. Reproductive: No mass or other significant abnormality. Other: Small fat containing bilateral inguinal hernias. No abdominopelvic ascites. Musculoskeletal: No acute or significant osseous findings. IMPRESSION: 1. No definite acute CT findings to explain abdominal or back pain. There may be minimal fat stranding about the pancreatic head (series 2, image 29). Correlate with biochemical evidence for acute pancreatitis. 2.  Other chronic and incidental findings as detailed above. Electronically Signed   By: Lauralyn PrimesAlex  Bibbey M.D.   On: 12/14/2018 13:06    Procedures Procedures (including critical care time)  Medications Ordered in ED Medications  sodium chloride (PF) 0.9 % injection (has no administration in time range)  sodium chloride flush (NS) 0.9 % injection 3 mL (3 mLs Intravenous Given 12/14/18 1009)  iohexol (OMNIPAQUE) 300 MG/ML solution 100 mL (100 mLs Intravenous Contrast Given 12/14/18 1224)  HYDROcodone-acetaminophen (NORCO/VICODIN) 5-325 MG per tablet 1 tablet (1 tablet Oral Given 12/14/18 1404)  alum & mag hydroxide-simeth (MAALOX/MYLANTA) 200-200-20 MG/5ML suspension 30 mL (30 mLs Oral Given 12/14/18 1411)    And   lidocaine (XYLOCAINE) 2 % viscous mouth solution 15 mL (15 mLs Oral Given 12/14/18 1411)     Initial Impression / Assessment and Plan / ED Course  I have reviewed the triage vital signs and the nursing notes.  Pertinent labs & imaging results that were available during my care of the patient were reviewed by me and considered in my medical decision making (see chart for details).       Patient presents today for evaluation of upper back and abdominal pain.  On exam his abdomen is generally tender to palpation, worse in the epigastric and bilateral lower quadrants.  Based on the location of his pain, along with  co-occurring thoracic back pain will obtain both abdomen and chest pain work ups.   Labs are obtained and reviewed, he does not have any significant hematologic or electrolyte derangements.  His lipase is significantly elevated at 211.  Troponin is not elevated.  He was more dynamically stable while in my care.  CT scanning abdomen pelvis was obtained showing evidence of pancreatitis with inflammatory stranding around the head of the pancreas.  This is consistent with his elevated lipase and nature of his pain.  He was in pain medicine while in the emergency room unable to pass p.o. challenge with his pain controlled.  He did not have any nausea or vomiting while in the emergency room.  Recommended bowel rest with clear liquid diet for 3 days.  PMP was reviewed and prescription for additional narcotic pain medicine was sent.  Recommended PCP follow-up if symptoms worsen.  Suspect a he has back pain from a combination of MSK pain and referred pain from his pancreas given non ischemic EKG, normal troponin, CXR with out acute abnormalities, and hot tachycardic or hypoxic.   Return precautions were discussed with patient who states their understanding.  At the time of discharge patient denied any unaddressed complaints or concerns.  Patient is agreeable for discharge home.    Final Clinical  Impressions(s) / ED Diagnoses   Final diagnoses:  Generalized abdominal pain  Acute pancreatitis without infection or necrosis, unspecified pancreatitis type    ED Discharge Orders         Ordered    HYDROcodone-acetaminophen (NORCO/VICODIN) 5-325 MG tablet  Every 6 hours PRN     12/14/18 1616           Cristina GongHammond,  W, [place]PA-C 12/14/18 1747    Raeford RazorKohut, Stephen, MD 12/17/18 [phone]

## 2018-12-14 NOTE — ED Triage Notes (Signed)
Pt states he has abd and back pain since Friday. pt states he thinks "it's acid and gas". Pt states no relief with Pepto bismol or gas x.

## 2018-12-14 NOTE — ED Notes (Signed)
Urinal @ bedside. Pt has been made aware that MD needs UA sample 

## 2018-12-14 NOTE — Discharge Instructions (Addendum)
Please follow the instructions for your clear liquid diet for the next 3 days.  If after then you feel better you may advance your diet by trying simple bland foods such as plain crackers, bananas, rice, applesauce, and toast.  If you do not feel better in 3 days or you develop fevers, or your symptoms worsen please seek additional medical care and evaluation.  Please take Tylenol (acetaminophen) to relieve your pain.  You may take tylenol, up to 1,000 mg (two extra strength pills).  Do not take more than 3,000 mg tylenol in a 24 hour period.  Please check all medication labels as many medications such as pain and cold medications may contain tylenol. Please do not drink alcohol while taking this medication.   Today you received medications that may make you sleepy or impair your ability to make decisions.  For the next 24 hours please do not drive, operate heavy machinery, care for a small child with out another adult present, or perform any activities that may cause harm to you or someone else if you were to fall asleep or be impaired.   You are being prescribed a medication which may make you sleepy. Please follow up of listed precautions for at least 24 hours after taking one dose.

## 2019-03-20 ENCOUNTER — Ambulatory Visit
Admission: EM | Admit: 2019-03-20 | Discharge: 2019-03-20 | Disposition: A | Discharge status: Home / Self Care | Payer: HRSA Program | Attending: Physician Assistant | Admitting: Physician Assistant

## 2019-03-20 ENCOUNTER — Other Ambulatory Visit: Payer: Self-pay

## 2019-03-20 ENCOUNTER — Encounter: Payer: Self-pay | Admitting: Emergency Medicine

## 2019-03-20 DIAGNOSIS — Z20822 Contact with and (suspected) exposure to covid-19: Secondary | ICD-10-CM

## 2019-03-20 DIAGNOSIS — Z20828 Contact with and (suspected) exposure to other viral communicable diseases: Secondary | ICD-10-CM

## 2019-03-20 HISTORY — DX: Unspecified asthma, uncomplicated: J45.909

## 2019-03-20 NOTE — ED Notes (Signed)
Patient able to ambulate independently  

## 2019-03-20 NOTE — ED Triage Notes (Signed)
Patient presents to [facility] for assessment after being exposed to his brother 1 week ago who they were notified tested positive for COVID.  Denies symptoms at this time/

## 2019-03-20 NOTE — Discharge Instructions (Signed)
COVID testing ordered. As discussed, given recent exposure without symptoms, you may still be in incubation period. Monitor for any symptoms such as cough, congestion, shortness of breath, loss of taste/smell, fever, to start self quarantine and may need retesting. Go to the emergency department for further evaluation if you develop significant shortness of breath, cannot speak in full sentences.  

## 2019-03-20 NOTE — ED Provider Notes (Signed)
[facility]    CSN: [id number] Arrival date & time: 03/20/19  1440      History   Chief Complaint Chief Complaint  Patient presents with  . COVID Exposure    HPI Scott Vance is a 53 y.o. male.   53 year old male comes in for COVID testing after positive contact 1 week ago.  He denies any symptoms.  Denies fever, chills, body aches.  Denies URI symptoms such as cough, congestion, sore throat.  Denies abdominal pain, nausea, vomiting, diarrhea.  Denies shortness of breath, loss of taste or smell.  No antipyretic use in the last 8 hours.     Past Medical History:  Diagnosis Date  . Asthma     There are no active problems to display for this patient.   History reviewed. No pertinent surgical history.     Home Medications    Prior to Admission medications   Medication Sig Start Date End Date Taking? Authorizing Provider  bismuth subsalicylate (PEPTO BISMOL) 262 MG/15ML suspension Take 30 mLs by mouth every 6 (six) hours as needed (stomach indigestion).    [provider]  ibuprofen (ADVIL) 200 MG tablet Take 200 mg by mouth every 6 (six) hours as needed for mild pain.    [provider]  dicyclomine (BENTYL) 20 MG tablet Take 1 tablet (20 mg total) by mouth 2 (two) times daily. Patient not taking: Reported on 12/14/2018 10/15/17 03/20/19  Margarita Mail, PA-C  famotidine (PEPCID) 20 MG tablet Take 1 tablet (20 mg total) by mouth 2 (two) times daily. Patient not taking: Reported on 12/14/2018 10/15/17 03/20/19  Margarita Mail, PA-C  fluticasone [facility]) 50 MCG/ACT nasal spray Place 2 sprays into both nostrils daily. Patient not taking: Reported on 12/14/2018 06/17/15 03/20/19  Larene Pickett, PA-C  simethicone (MYLICON) 80 MG chewable tablet Chew 80 mg by mouth every 6 (six) hours as needed for flatulence.  03/20/19  [provider]  sucralfate (CARAFATE) 1 g tablet Take 1 tablet (1 g total) by mouth 3 (three) times daily. Patient not  taking: Reported on 12/14/2018 10/15/17 03/20/19  Margarita Mail, PA-C    Family History History reviewed. No pertinent family history.  Social History Social History   Tobacco Use  . Smoking status: Current Some Day Smoker    Types: Cigarettes  . Smokeless tobacco: Never Used  Substance Use Topics  . Alcohol use: No  . Drug use: No     Allergies   Patient has no known allergies.   Review of Systems Review of Systems  Reason unable to perform ROS: See HPI as above.     Physical Exam Triage Vital Signs ED Triage Vitals [03/20/19 1454]  Enc Vitals Group     BP (!) 162/93     Pulse Rate 85     Resp 18     Temp 98.2 F (36.8 C)     Temp Source Oral     SpO2 97 %     Weight      Height      Head Circumference      Peak Flow      Pain Score 0     Pain Loc      Pain Edu?      Excl. in [place]?    No data found.  Updated Vital Signs BP (!) 162/93 (BP Location: Left Arm)   Pulse 85   Temp 98.2 F (36.8 C) (Oral)   Resp 18   SpO2 97%  Physical Exam Constitutional:      General: He is not in acute distress.    Appearance: He is well-developed. He is not diaphoretic.  HENT:     Head: Normocephalic and atraumatic.  Eyes:     Conjunctiva/sclera: Conjunctivae normal.     Pupils: Pupils are equal, round, and reactive to light.  Pulmonary:     Effort: Pulmonary effort is normal. No respiratory distress.  Skin:    General: Skin is warm and dry.  Neurological:     Mental Status: He is alert and oriented to person, place, and time.      UC Treatments / Results  Labs (all labs ordered are listed, but only abnormal results are displayed) Labs Reviewed  NOVEL CORONAVIRUS, NAA    EKG   Radiology No results found.  Procedures Procedures (including critical care time)  Medications Ordered in UC Medications - No data to display  Initial Impression / Assessment and Plan / UC Course  I have reviewed the triage vital signs and the nursing notes.   Pertinent labs & imaging results that were available during my care of the patient were reviewed by me and considered in my medical decision making (see chart for details).    Discussed with patient, given positive exposure without symptoms, could still be within incubation period. If develop symptoms, may need retesting.  Patient expresses understanding and would like to proceed with testing.  COVID testing ordered.  Patient will continue to monitor symptoms.  To self quarantine if develop any symptoms.  Return precautions given.  Final Clinical Impressions(s) / UC Diagnoses   Final diagnoses:  Exposure to Covid-19 Virus   ED Prescriptions    None     PDMP not reviewed this encounter.   Belinda Fisher, PA-C 03/20/19 1738

## 2019-03-21 LAB — NOVEL CORONAVIRUS, NAA: SARS-CoV-2, NAA: NOT DETECTED

## 2019-10-16 ENCOUNTER — Ambulatory Visit (HOSPITAL_COMMUNITY)
Admission: EM | Admit: 2019-10-16 | Discharge: 2019-10-16 | Disposition: A | Discharge status: Home / Self Care | Payer: Self-pay | Attending: Family Medicine | Admitting: Family Medicine

## 2019-10-16 ENCOUNTER — Other Ambulatory Visit: Payer: Self-pay

## 2019-10-16 DIAGNOSIS — M5431 Sciatica, right side: Secondary | ICD-10-CM

## 2019-10-16 MED ORDER — PREDNISONE 10 MG PO TABS: ORAL_TABLET | ORAL | 0 refills | Status: DC

## 2019-10-16 MED ORDER — CYCLOBENZAPRINE HCL 5 MG PO TABS: 5.0000 mg | ORAL_TABLET | Freq: Two times a day (BID) | ORAL | 0 refills | Status: DC | PRN

## 2019-10-16 NOTE — ED Triage Notes (Signed)
Low back pain radiating down R leg x 1 week

## 2019-10-16 NOTE — Discharge Instructions (Signed)
I believe your back and leg pain is coming from sciatica Begin prednisone taper over the next 6 days-begin with 6 tabs, decrease by 1 tablet each day until complete-take with food and in the morning if you are able You may use flexeril as needed to help with pain. This is a muscle relaxer and causes sedation- please use only at bedtime or when you will be home and not have to drive/work Alternate ice and heat Gentle stretching-see attached  Please follow-up if pain not improving or worsening, developing any increased leg weakness pain or swelling

## 2019-10-16 NOTE — ED Provider Notes (Signed)
[facility]    CSN: [id number] Arrival date & time: 10/16/19  0818      History   Chief Complaint Chief Complaint  Patient presents with  . Leg Pain    HPI Scott Vance is a 54 y.o. male history of asthma presenting today for evaluation of back and leg pain.  Patient notes over the past week he has had pain in his low back that also occasionally shoots into his right leg and with occasional paresthesias in toes.  He denies any injury or fall.  Denies any specific heavy lifting or increase in activity.  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  Denies history of similar.  Denies urinary symptoms.  Denies history of kidney stones.  Denies leg swelling.  Denies history of diabetes.  HPI  Past Medical History:  Diagnosis Date  . Asthma     There are no problems to display for this patient.   No past surgical history on file.     Home Medications    Prior to Admission medications   Medication Sig Start Date End Date Taking? Authorizing Provider  bismuth subsalicylate (PEPTO BISMOL) 262 MG/15ML suspension Take 30 mLs by mouth every 6 (six) hours as needed (stomach indigestion).    [provider]  cyclobenzaprine (FLEXERIL) 5 MG tablet Take 1-2 tablets (5-10 mg total) by mouth 2 (two) times daily as needed for muscle spasms. 10/16/19   Kaiyden Simkin C, PA-C  ibuprofen (ADVIL) 200 MG tablet Take 200 mg by mouth every 6 (six) hours as needed for mild pain.    [provider]  predniSONE (DELTASONE) 10 MG tablet Begin with 6 tabs on day 1, 5 tab on day 2, 4 tab on day 3, 3 tab on day 4, 2 tab on day 5, 1 tab on day 6-take with food 10/16/19   Rechelle Niebla C, PA-C  dicyclomine (BENTYL) 20 MG tablet Take 1 tablet (20 mg total) by mouth 2 (two) times daily. Patient not taking: Reported on 12/14/2018 10/15/17 03/20/19  Arthor Captain, PA-C  famotidine (PEPCID) 20 MG tablet Take 1 tablet (20 mg total) by mouth 2 (two) times  daily. Patient not taking: Reported on 12/14/2018 10/15/17 03/20/19  Arthor Captain, PA-C  fluticasone [facility]) 50 MCG/ACT nasal spray Place 2 sprays into both nostrils daily. Patient not taking: Reported on 12/14/2018 06/17/15 03/20/19  Garlon Hatchet, PA-C  simethicone (MYLICON) 80 MG chewable tablet Chew 80 mg by mouth every 6 (six) hours as needed for flatulence.  03/20/19  [provider]  sucralfate (CARAFATE) 1 g tablet Take 1 tablet (1 g total) by mouth 3 (three) times daily. Patient not taking: Reported on 12/14/2018 10/15/17 03/20/19  Arthor Captain, PA-C    Family History No family history on file.  Social History Social History   Tobacco Use  . Smoking status: Current Some Day Smoker    Types: Cigarettes  . Smokeless tobacco: Never Used  Substance Use Topics  . Alcohol use: No  . Drug use: No     Allergies   Patient has no known allergies.   Review of Systems Review of Systems  Constitutional: Negative for fatigue and fever.  Eyes: Negative for redness, itching and visual disturbance.  Respiratory: Negative for shortness of breath.   Cardiovascular: Negative for chest pain and leg swelling.  Gastrointestinal: Negative for nausea and vomiting.  Genitourinary: Negative for decreased urine volume, difficulty urinating, flank pain and hematuria.  Musculoskeletal: Positive for back pain and myalgias. Negative for arthralgias.  Skin: Negative for color change, rash and wound.  Neurological: Negative for dizziness, syncope, weakness, light-headedness and headaches.     Physical Exam Triage Vital Signs ED Triage Vitals [10/16/19 0848]  Enc Vitals Group     BP (!) 153/91     Pulse Rate 84     Resp 16     Temp 98.4 F (36.9 C)     Temp src      SpO2 95 %     Weight      Height      Head Circumference      Peak Flow      Pain Score 6     Pain Loc      Pain Edu?      Excl. in GC?    No data found.  Updated Vital Signs BP (!) 153/91   Pulse 84    Temp 98.4 F (36.9 C)   Resp 16   SpO2 95%   Visual Acuity Right Eye Distance:   Left Eye Distance:   Bilateral Distance:    Right Eye Near:   Left Eye Near:    Bilateral Near:     Physical Exam Vitals and nursing note reviewed.  Constitutional:      Appearance: He is well-developed.     Comments: No acute distress  HENT:     Head: Normocephalic and atraumatic.     Nose: Nose normal.  Eyes:     Conjunctiva/sclera: Conjunctivae normal.  Cardiovascular:     Rate and Rhythm: Normal rate.  Pulmonary:     Effort: Pulmonary effort is normal. No respiratory distress.  Abdominal:     General: There is no distension.  Musculoskeletal:        General: Normal range of motion.     Cervical back: Neck supple.     Comments: Mild tenderness to palpation of lower lumbar spine midline, minimal reproducible tenderness throughout lateral lumbar musculature bilaterally  Strength at hips and knees 5/5 and equal bilaterally  Gait without abnormality  Bilateral lower leg symmetric without calf tenderness or swelling  Skin:    General: Skin is warm and dry.  Neurological:     Mental Status: He is alert and oriented to person, place, and time.      UC Treatments / Results  Labs (all labs ordered are listed, but only abnormal results are displayed) Labs Reviewed - No data to display  EKG   Radiology No results found.  Procedures Procedures (including critical care time)  Medications Ordered in UC Medications - No data to display  Initial Impression / Assessment and Plan / UC Course  I have reviewed the triage vital signs and the nursing notes.  Pertinent labs & imaging results that were available during my care of the patient were reviewed by me and considered in my medical decision making (see chart for details).     Symptoms suggestive of sciatica/low back pain with radicular distribution.  Recommending prednisone taper along with muscle relaxers and activity  modification.  Gentle stretching.Discussed strict return precautions. Patient verbalized understanding and is agreeable with plan.  Final Clinical Impressions(s) / UC Diagnoses   Final diagnoses:  Sciatica of right side     Discharge Instructions     I believe your back and leg pain is coming from sciatica Begin prednisone taper over the next 6 days-begin with 6 tabs, decrease by 1 tablet each day until complete-take  with food and in the morning if you are able You may use flexeril as needed to help with pain. This is a muscle relaxer and causes sedation- please use only at bedtime or when you will be home and not have to drive/work Alternate ice and heat Gentle stretching-see attached  Please follow-up if pain not improving or worsening, developing any increased leg weakness pain or swelling   ED Prescriptions    Medication Sig Dispense Auth. Provider   predniSONE (DELTASONE) 10 MG tablet Begin with 6 tabs on day 1, 5 tab on day 2, 4 tab on day 3, 3 tab on day 4, 2 tab on day 5, 1 tab on day 6-take with food 21 tablet Armari Fussell C, PA-C   cyclobenzaprine (FLEXERIL) 5 MG tablet Take 1-2 tablets (5-10 mg total) by mouth 2 (two) times daily as needed for muscle spasms. 24 tablet Diarra Kos, [place] C, PA-C     PDMP not reviewed this encounter.   Janith Lima, [place] 10/16/19 [phone]

## 2020-10-13 ENCOUNTER — Encounter (HOSPITAL_COMMUNITY): Payer: Self-pay

## 2020-10-13 ENCOUNTER — Ambulatory Visit (HOSPITAL_COMMUNITY)
Admission: EM | Admit: 2020-10-13 | Discharge: 2020-10-13 | Disposition: A | Discharge status: Home / Self Care | Payer: Self-pay | Attending: Family Medicine | Admitting: Family Medicine

## 2020-10-13 DIAGNOSIS — Z23 Encounter for immunization: Secondary | ICD-10-CM

## 2020-10-13 DIAGNOSIS — S91332A Puncture wound without foreign body, left foot, initial encounter: Secondary | ICD-10-CM | POA: Insufficient documentation

## 2020-10-13 DIAGNOSIS — L84 Corns and callosities: Secondary | ICD-10-CM | POA: Insufficient documentation

## 2020-10-13 DIAGNOSIS — L918 Other hypertrophic disorders of the skin: Secondary | ICD-10-CM | POA: Insufficient documentation

## 2020-10-13 DIAGNOSIS — R030 Elevated blood-pressure reading, without diagnosis of hypertension: Secondary | ICD-10-CM | POA: Insufficient documentation

## 2020-10-13 MED ORDER — TETANUS-DIPHTH-ACELL PERTUSSIS 5-2.5-18.5 LF-MCG/0.5 IM SUSY
PREFILLED_SYRINGE | INTRAMUSCULAR | Status: AC
  Filled 2020-10-13: qty 0.5

## 2020-10-13 MED ORDER — TETANUS-DIPHTH-ACELL PERTUSSIS 5-2.5-18.5 LF-MCG/0.5 IM SUSY
0.5000 mL | PREFILLED_SYRINGE | Freq: Once | INTRAMUSCULAR | Status: AC
  Administered 2020-10-13: 0.5 mL via INTRAMUSCULAR

## 2020-10-13 NOTE — Discharge Instructions (Addendum)
I have sent the removed skin tag to pathology for evaluation. We will contact you with any significant results.  You may also review your test results online through MyChart. If you do not have a MyChart account, instructions to sign up should be on your discharge paperwork.  Your blood pressure was noted to be elevated during your visit today. If you are currently taking medication for high blood pressure, please ensure you are taking this as directed. If you do not have a history of high blood pressure and your blood pressure remains persistently elevated, you may need to begin taking a medication at some point. You may return here within the next few days to recheck if unable to see your primary care provider or if you do not have a one.  BP (!) 150/106   Pulse 68   Temp 97.6 F (36.4 C)   Resp 20   SpO2 97%   BP Readings from Last 3 Encounters:  10/13/20 (!) 150/106  10/16/19 (!) 153/91  03/20/19 (!) 162/93   Meds ordered this encounter  Medications   Tdap (BOOSTRIX) injection 0.5 mL

## 2020-10-13 NOTE — ED Triage Notes (Signed)
Pt in with c/o "corn on his foot" that has been on his foot for a while but he wants to get it checked it   Pt also wants mole on his neck checked out States the mole on his neck has grown in size  Pt also states he need a TDAP because he stepped on a nail 1 month ago

## 2020-10-14 LAB — SURGICAL PATHOLOGY

## 2020-10-15 NOTE — ED Provider Notes (Signed)
[facility]   [id number] 10/13/20 Arrival Time: 3244  ASSESSMENT & PLAN:  1. Callus of foot   2. Puncture wound of left foot, initial encounter   3. Inflamed skin tag   4. Elevated blood pressure reading without diagnosis of hypertension     Pathology pending. Will notify of results. Tdap given. No signs of skin infection.   Discharge Instructions      I have sent the removed skin tag to pathology for evaluation. We will contact you with any significant results.  You may also review your test results online through MyChart. If you do not have a MyChart account, instructions to sign up should be on your discharge paperwork.  Your blood pressure was noted to be elevated during your visit today. If you are currently taking medication for high blood pressure, please ensure you are taking this as directed. If you do not have a history of high blood pressure and your blood pressure remains persistently elevated, you may need to begin taking a medication at some point. You may return here within the next few days to recheck if unable to see your primary care provider or if you do not have a one.  BP (!) 150/106   Pulse 68   Temp 97.6 F (36.4 C)   Resp 20   SpO2 97%   BP Readings from Last 3 Encounters:  10/13/20 (!) 150/106  10/16/19 (!) 153/91  03/20/19 (!) 162/93   Meds ordered this encounter  Medications  . Tdap (BOOSTRIX) injection 0.5 mL        Procedure: Area of skin lesion over anterior left neck cleaned. Local anesthesia with 1% plain lidocaine. Shave biopsy performed with #11 scalpel. Bleeding controlled with silver nitrate. No complications. Tolerated procedure well.    Follow-up Information    Schedule an appointment as soon as possible for a visit  with [facility]).   Contact information: [address] [place],  [place]  [address]  [phone]              Reviewed expectations re: course of current  medical issues. Questions answered. Outlined signs and symptoms indicating need for more acute intervention. Understanding verbalized. After Visit Summary given.   SUBJECTIVE: History from: patient. Scott RONAL Vance is a 55 y.o. male who reports recent puncture wound to left foot; minimal bleeding. Needs Td. Afebrile. No foot pain reported. Also with calluses of feet. Also with skin lesion of anterior neck; present for "very long time"; enlarging; irritated at times. Desires removal.  Increased blood pressure noted today. Reports that he has not been treated for hypertension in the past.  He reports no chest pain on exertion, no dyspnea on exertion, no swelling of ankles, no orthostatic dizziness or lightheadedness, no orthopnea or paroxysmal nocturnal dyspnea and no palpitations.   OBJECTIVE:  Vitals:   10/13/20 0910  BP: (!) 150/106  Pulse: 68  Resp: 20  Temp: 97.6 F (36.4 C)  SpO2: 97%    General appearance: alert; no distress Eyes: PERRLA; EOMI; conjunctiva normal HENT: [place]; AT Neck: supple  Lungs: speaks full sentences without difficulty; unlabored Extremities: no edema Skin: warm and dry; no open wounds of left foot; approx 0.25x0.5 skin lesion hanging from anterior neck; bilateral feet with callus formation Neurologic: normal gait Psychological: alert and cooperative; normal mood and affect  Labs: Labs Reviewed  SURGICAL PATHOLOGY     No Known Allergies  Past Medical History:  Diagnosis Date  .  Asthma    Social History   Socioeconomic History  . Marital status: Single    Spouse name: Not on file  . Number of children: Not on file  . Years of education: Not on file  . Highest education level: Not on file  Occupational History  . Not on file  Tobacco Use  . Smoking status: Current Some Day Smoker    Types: Cigarettes  . Smokeless tobacco: Never Used  Vaping Use  . Vaping Use: Never used  Substance and Sexual Activity  . Alcohol use: No  . Drug  use: No  . Sexual activity: Not on file  Other Topics Concern  . Not on file  Social History Narrative  . Not on file   Social Determinants of Health   Financial Resource Strain: Not on file  Food Insecurity: Not on file  Transportation Needs: Not on file  Physical Activity: Not on file  Stress: Not on file  Social Connections: Not on file  Intimate Partner Violence: Not on file   History reviewed. No pertinent family history. History reviewed. No pertinent surgical history.   Mardella Layman, MD 10/15/20 [phone]

## 2020-11-01 ENCOUNTER — Ambulatory Visit (HOSPITAL_COMMUNITY)
Admission: EM | Admit: 2020-11-01 | Discharge: 2020-11-01 | Disposition: A | Discharge status: Home / Self Care | Payer: Self-pay | Attending: Medical Oncology | Admitting: Medical Oncology

## 2020-11-01 ENCOUNTER — Other Ambulatory Visit: Payer: Self-pay

## 2020-11-01 ENCOUNTER — Encounter (HOSPITAL_COMMUNITY): Payer: Self-pay

## 2020-11-01 ENCOUNTER — Ambulatory Visit (INDEPENDENT_AMBULATORY_CARE_PROVIDER_SITE_OTHER): Payer: Self-pay

## 2020-11-01 DIAGNOSIS — G8929 Other chronic pain: Secondary | ICD-10-CM

## 2020-11-01 DIAGNOSIS — B070 Plantar wart: Secondary | ICD-10-CM

## 2020-11-01 DIAGNOSIS — M79672 Pain in left foot: Secondary | ICD-10-CM

## 2020-11-01 DIAGNOSIS — M79671 Pain in right foot: Secondary | ICD-10-CM

## 2020-11-01 NOTE — ED Provider Notes (Signed)
[facility]    CSN: [id number] Arrival date & time: 11/01/20  1007      History   Chief Complaint Chief Complaint  Patient presents with  . Foot Pain    HPI Scott Vance is a 55 y.o. male.   HPI  Foot Pain: Patient reports that for about 1 to 2 months he has had left foot pain.  No known injury although he states many years ago he stepped on a nail of this foot he believes.  He reports that he is concerned that he has an infection or gangrene of his foot requests an x-ray multiple times during our encounter.  He has tried various shoes to help with symptoms but none have caused him significant improvement of his symptoms.  Past Medical History:  Diagnosis Date  . Asthma     There are no problems to display for this patient.   History reviewed. No pertinent surgical history.     Home Medications    Prior to Admission medications   Medication Sig Start Date End Date Taking? Authorizing Provider  bismuth subsalicylate (PEPTO BISMOL) 262 MG/15ML suspension Take 30 mLs by mouth every 6 (six) hours as needed (stomach indigestion).    [provider]  cyclobenzaprine (FLEXERIL) 5 MG tablet Take 1-2 tablets (5-10 mg total) by mouth 2 (two) times daily as needed for muscle spasms. 10/16/19   Wieters, Hallie C, PA-C  ibuprofen (ADVIL) 200 MG tablet Take 200 mg by mouth every 6 (six) hours as needed for mild pain.    [provider]  predniSONE (DELTASONE) 10 MG tablet Begin with 6 tabs on day 1, 5 tab on day 2, 4 tab on day 3, 3 tab on day 4, 2 tab on day 5, 1 tab on day 6-take with food 10/16/19   Wieters, Hallie C, PA-C  dicyclomine (BENTYL) 20 MG tablet Take 1 tablet (20 mg total) by mouth 2 (two) times daily. Patient not taking: Reported on 12/14/2018 10/15/17 03/20/19  Arthor Captain, PA-C  famotidine (PEPCID) 20 MG tablet Take 1 tablet (20 mg total) by mouth 2 (two) times daily. Patient not taking: Reported on 12/14/2018 10/15/17 03/20/19  Arthor Captain, PA-C  fluticasone [facility]) 50 MCG/ACT nasal spray Place 2 sprays into both nostrils daily. Patient not taking: Reported on 12/14/2018 06/17/15 03/20/19  Garlon Hatchet, PA-C  simethicone (MYLICON) 80 MG chewable tablet Chew 80 mg by mouth every 6 (six) hours as needed for flatulence.  03/20/19  [provider]  sucralfate (CARAFATE) 1 g tablet Take 1 tablet (1 g total) by mouth 3 (three) times daily. Patient not taking: Reported on 12/14/2018 10/15/17 03/20/19  Arthor Captain, PA-C    Family History History reviewed. No pertinent family history.  Social History Social History   Tobacco Use  . Smoking status: Current Some Day Smoker    Types: Cigarettes  . Smokeless tobacco: Never Used  Vaping Use  . Vaping Use: Never used  Substance Use Topics  . Alcohol use: No  . Drug use: No     Allergies   Patient has no known allergies.   Review of Systems Review of Systems  As stated above in HPI Physical Exam Triage Vital Signs ED Triage Vitals  Enc Vitals Group     BP 11/01/20 1039 (!) 168/104     Pulse Rate 11/01/20 1039 70     Resp 11/01/20 1039 19     Temp 11/01/20 1039 98 F (36.7 C)  Temp Source 11/01/20 1039 Oral     SpO2 11/01/20 1039 96 %     Weight --      Height --      Head Circumference --      Peak Flow --      Pain Score 11/01/20 1038 9     Pain Loc --      Pain Edu? --      Excl. in GC? --    No data found.  Updated Vital Signs BP (!) 168/104 (BP Location: Right Arm)   Pulse 70   Temp 98 F (36.7 C) (Oral)   Resp 19   SpO2 96%   Physical Exam Vitals and nursing note reviewed.  Constitutional:      General: He is not in acute distress.    Appearance: Normal appearance. He is not ill-appearing, toxic-appearing or diaphoretic.  HENT:     Head: Normocephalic and atraumatic.  Musculoskeletal:        General: No swelling, tenderness, deformity or signs of injury. Normal range of motion.     Comments: There is a 1 cm round  plantar wart of the left distal foot near base of 5th toe. No erythema or discharge  Skin:    General: Skin is warm.  Neurological:     General: No focal deficit present.     Mental Status: He is alert and oriented to person, place, and time.     Sensory: No sensory deficit.     Motor: No weakness.     Gait: Gait normal.      UC Treatments / Results  Labs (all labs ordered are listed, but only abnormal results are displayed) Labs Reviewed - No data to display  EKG   Radiology No results found.  Procedures Procedures (including critical care time)  Medications Ordered in UC Medications - No data to display  Initial Impression / Assessment and Plan / UC Course  I have reviewed the triage vital signs and the nursing notes.  Pertinent labs & imaging results that were available during my care of the patient were reviewed by me and considered in my medical decision making (see chart for details).     New.  I discussed with patient that his pain is likely coming from the plantar wart.  I have recommended topical wart therapy treatment or referral to podiatry given the size of the wart.  I tried to reassure patient multiple times that I do not believe that an x-ray is needed and that an x-ray in this case would not show any sign of infection.  We also discussed why infection and gangrene is a very low differential of mine however patient continued to be very unsure and persistent regarding an x-ray. X ray pending.   UPDATE: X ray shows a small heel spur which does not appear to be causing his pain. Again discussed plantar wart treatment and provided reassurance. Podiatry as needed Final Clinical Impressions(s) / UC Diagnoses   Final diagnoses:  None   Discharge Instructions   None    ED Prescriptions    None     PDMP not reviewed this encounter.   Rushie Chestnut, [place] 11/01/20 1124

## 2020-11-01 NOTE — ED Triage Notes (Signed)
Pt in with c/o left foot pain that has been going on for over 1 month.  Pt states he has a new sore on his foot and the pain now radiating to the top of his foot

## 2021-05-10 ENCOUNTER — Ambulatory Visit (HOSPITAL_COMMUNITY)
Admission: EM | Admit: 2021-05-10 | Discharge: 2021-05-10 | Disposition: A | Discharge status: Home / Self Care | Payer: Self-pay | Attending: Nurse Practitioner | Admitting: Nurse Practitioner

## 2021-05-10 ENCOUNTER — Other Ambulatory Visit: Payer: Self-pay

## 2021-05-10 ENCOUNTER — Encounter (HOSPITAL_COMMUNITY): Payer: Self-pay | Admitting: *Deleted

## 2021-05-10 DIAGNOSIS — F17210 Nicotine dependence, cigarettes, uncomplicated: Secondary | ICD-10-CM | POA: Insufficient documentation

## 2021-05-10 DIAGNOSIS — Z716 Tobacco abuse counseling: Secondary | ICD-10-CM | POA: Insufficient documentation

## 2021-05-10 DIAGNOSIS — R030 Elevated blood-pressure reading, without diagnosis of hypertension: Secondary | ICD-10-CM | POA: Insufficient documentation

## 2021-05-10 DIAGNOSIS — W57XXXA Bitten or stung by nonvenomous insect and other nonvenomous arthropods, initial encounter: Secondary | ICD-10-CM | POA: Insufficient documentation

## 2021-05-10 DIAGNOSIS — S60861A Insect bite (nonvenomous) of right wrist, initial encounter: Secondary | ICD-10-CM | POA: Insufficient documentation

## 2021-05-10 MED ORDER — METHYLPREDNISOLONE SODIUM SUCC 125 MG IJ SOLR
INTRAMUSCULAR | Status: AC
  Filled 2021-05-10: qty 2

## 2021-05-10 MED ORDER — METHYLPREDNISOLONE SODIUM SUCC 125 MG IJ SOLR
80.0000 mg | Freq: Once | INTRAMUSCULAR | Status: AC
  Administered 2021-05-10: 80 mg via INTRAMUSCULAR

## 2021-05-10 NOTE — Discharge Instructions (Addendum)
INSECT BITE: Your insect bite does not show any signs of infection. We sent off blood testing to check for Lymes disease or rocky mountain spotted fever. You will be notified of these results in a couple of days. Further interventions may be needed if these tests are positive. Symptoms often get better and go away on their own. Do not scratch the bite area. Keep it clean and dry. Ice can help with pain, itching and discomfort from the bite.  HIGH BLOOD PRESSURE: While you were here, we noticed that your blood pressure was elevated. Hypertension is when the force of blood pumping through your arteries is too strong. If this condition is not controlled, it may put you at risk for serious complications.Your personal target blood pressure may vary depending on your medical conditions, your age, and other factors. For most people, a normal blood pressure is less than 120/80. Your blood pressure today was 173/112. Hypertension is treated with lifestyle changes, medicines, or a combination of both. Lifestyle changes include losing weight, eating a healthy, low-sodium diet, exercising more, stop smoking and limiting alcohol. It is very important to monitor your blood pressure closely. Follow-up with your primary care provider within the next three days for a blood pressure recheck. You may need to be started on medications if your pressure does not improve.   Go to the ED immediately if you:   Develop a severe headache or confusion. Have unusual weakness or numbness in your face, hands, feet or body  Feel faint or like you're going to pass out  Have severe pain in your chest or abdomen. Vomit repeatedly. Have trouble breathing.

## 2021-05-10 NOTE — ED Provider Notes (Signed)
[facility]    CSN: [id number] Arrival date & time: 05/10/21  1458      History   Chief Complaint Chief Complaint  Patient presents with   Insect Bite    HPI Scott Vance is a 55 y.o. male.   Subjective:  Scott Vance is a 55 y.o. male seen in consultation for evaluation for possible insect bite. He felt something attach to his right wrist 5 days ago. He quickly removed it with gently pressure. He says that it produced a clear discharged when he squeezed it out of his skin. He does not know what it was. He reports swelling to the area that lasted a couple of days. He denies any redness or warmth to the area. He has a tight feeling at the site of the bite as well to the right hand. He denies any weakness, loss of sensation or paraesthesias. There were no anaphylactic reactions or systemic symptoms noted.          Past Medical History:  Diagnosis Date   Asthma     There are no problems to display for this patient.   History reviewed. No pertinent surgical history.     Home Medications    Prior to Admission medications   Medication Sig Start Date End Date Taking? Authorizing Provider  dicyclomine (BENTYL) 20 MG tablet Take 1 tablet (20 mg total) by mouth 2 (two) times daily. Patient not taking: Reported on 12/14/2018 10/15/17 03/20/19  Arthor Captain, PA-C  famotidine (PEPCID) 20 MG tablet Take 1 tablet (20 mg total) by mouth 2 (two) times daily. Patient not taking: Reported on 12/14/2018 10/15/17 03/20/19  Arthor Captain, PA-C  fluticasone [facility]) 50 MCG/ACT nasal spray Place 2 sprays into both nostrils daily. Patient not taking: Reported on 12/14/2018 06/17/15 03/20/19  Garlon Hatchet, PA-C  simethicone (MYLICON) 80 MG chewable tablet Chew 80 mg by mouth every 6 (six) hours as needed for flatulence.  03/20/19  [provider]  sucralfate (CARAFATE) 1 g tablet Take 1 tablet (1 g total) by mouth 3 (three) times daily. Patient not taking:  Reported on 12/14/2018 10/15/17 03/20/19  Arthor Captain, PA-C    Family History History reviewed. No pertinent family history.  Social History Social History   Tobacco Use   Smoking status: Some Days    Types: Cigarettes   Smokeless tobacco: Never  Vaping Use   Vaping Use: Never used  Substance Use Topics   Alcohol use: No   Drug use: No     Allergies   Patient has no known allergies.   Review of Systems Review of Systems  Constitutional:  Negative for fatigue and fever.  Respiratory:  Negative for shortness of breath.   Cardiovascular:  Negative for chest pain, palpitations and leg swelling.  Gastrointestinal:  Negative for nausea and vomiting.  Musculoskeletal:  Negative for myalgias, neck pain and neck stiffness.  Skin:  Positive for wound.  Neurological:  Negative for dizziness, weakness, numbness and headaches.  All other systems reviewed and are negative.   Physical Exam Triage Vital Signs ED Triage Vitals  Enc Vitals Group     BP 05/10/21 1657 (!) 181/117     Pulse Rate 05/10/21 1657 76     Resp 05/10/21 1657 18     Temp 05/10/21 1657 97.8 F (36.6 C)     Temp src --      SpO2 05/10/21 1657 97 %     Weight --  Height --      Head Circumference --      Peak Flow --      Pain Score 05/10/21 1655 7     Pain Loc --      Pain Edu? --      Excl. in [place]? --    No data found.  Updated Vital Signs BP (!) 173/112 (BP Location: Right Arm)   Pulse 76   Temp 97.8 F (36.6 C)   Resp 18   SpO2 97%   Visual Acuity Right Eye Distance:   Left Eye Distance:   Bilateral Distance:    Right Eye Near:   Left Eye Near:    Bilateral Near:     Physical Exam Vitals reviewed.  Constitutional:      General: He is not in acute distress.    Appearance: Normal appearance. He is normal weight. He is not ill-appearing, toxic-appearing or diaphoretic.  HENT:     Head: Normocephalic.  Cardiovascular:     Rate and Rhythm: Normal rate.     Pulses: Normal  pulses.  Pulmonary:     Effort: Pulmonary effort is normal.     Breath sounds: Normal breath sounds.  Abdominal:     General: Bowel sounds are normal.  Musculoskeletal:        General: Normal range of motion.     Right wrist: No swelling, deformity or tenderness. Normal range of motion.       Arms:     Cervical back: Normal range of motion and neck supple. No rigidity or tenderness.  Lymphadenopathy:     Cervical: No cervical adenopathy.  Skin:    General: Skin is warm and dry.  Neurological:     General: No focal deficit present.     Mental Status: He is alert and oriented to person, place, and time.  Psychiatric:        Mood and Affect: Mood normal.        Behavior: Behavior normal.     UC Treatments / Results  Labs (all labs ordered are listed, but only abnormal results are displayed) Labs Reviewed  LYME DISEASE SEROLOGY W/REFLEX  ROCKY MTN SPOTTED FVR ABS PNL(IGG+IGM)    EKG   Radiology No results found.  Procedures Procedures (including critical care time)  Medications Ordered in UC Medications  methylPREDNISolone sodium succinate (SOLU-MEDROL) 125 mg/2 mL injection 80 mg (80 mg Intramuscular Given 05/10/21 1829)    Initial Impression / Assessment and Plan / UC Course  I have reviewed the triage vital signs and the nursing notes.  Pertinent labs & imaging results that were available during my care of the patient were reviewed by me and considered in my medical decision making (see chart for details).     55 yo male presenting with tightness of the hand after being bit by an insect 5 days ago. He denies any redness, pain or warmth to the site. Swelling was present but has since resolved. No weakness, loss of sensation or paraesthesias. There were no anaphylactic reactions or systemic symptoms noted. Pinpoint wound noted where patient reports he was bit at. No erythema, swelling, drainage or focal deficits noted on exam. Will check lymes and RMSF. Solumedrol  80 mg IM given as well.   Notably, patient noted to be hypertensive with initial BP 181/117. He denies any history of hypertension. He is a smoker. No chest pain, shortness of breath, palpitations, headache or blurred vision. Repeat BP 173/112.  He should follow-up with  his primary care within the next 3 days for BP recheck.   Plan:  No specific avoidance measures or medications required regarding the insect bite  Further interventions pending lymes and RMSF testing  Monitor for worsening symptoms or development of systemic systems  BP monitoring, smoking cessation and indications for immediate follow-up discussed   Today's evaluation has revealed no signs of a dangerous process. Discussed diagnosis with patient and/or guardian. Patient and/or guardian aware of their diagnosis, possible red flag symptoms to watch out for and need for close follow up. Patient and/or guardian understands verbal and written discharge instructions. Patient and/or guardian comfortable with plan and disposition.  Patient and/or guardian has a clear mental status at this time, good insight into illness (after discussion and teaching) and has clear judgment to make decisions regarding their care  This care was provided during an unprecedented National Emergency due to the Novel Coronavirus (COVID-19) pandemic. COVID-19 infections and transmission risks place heavy strains on healthcare resources.  As this pandemic evolves, our facility, providers, and staff strive to respond fluidly, to remain operational, and to provide care relative to available resources and information. Outcomes are unpredictable and treatments are without well-defined guidelines. Further, the impact of COVID-19 on all aspects of urgent care, including the impact to patients seeking care for reasons other than COVID-19, is unavoidable during this national emergency. At this time of the global pandemic, management of patients has significantly changed, even  for non-COVID positive patients given high local and regional COVID volumes at this time requiring high healthcare system and resource utilization. The standard of care for management of both COVID suspected and non-COVID suspected patients continues to change rapidly at the local, regional, national, and global levels. This patient was worked up and treated to the best available but ever changing evidence and resources available at this current time.   Documentation was completed with the aid of voice recognition software. Transcription may contain typographical errors.   Final Clinical Impressions(s) / UC Diagnoses   Final diagnoses:  Insect bite of right wrist, initial encounter  Elevated blood pressure reading without diagnosis of hypertension  Tobacco abuse counseling     Discharge Instructions      INSECT BITE: Your insect bite does not show any signs of infection. We sent off blood testing to check for Lymes disease or rocky [place] spotted fever. You will be notified of these results in a couple of days. Further interventions may be needed if these tests are positive. Symptoms often get better and go away on their own. Do not scratch the bite area. Keep it clean and dry. Ice can help with pain, itching and discomfort from the bite.  HIGH BLOOD PRESSURE: While you were here, we noticed that your blood pressure was elevated. Hypertension is when the force of blood pumping through your arteries is too strong. If this condition is not controlled, it may put you at risk for serious complications.Your personal target blood pressure may vary depending on your medical conditions, your age, and other factors. For most people, a normal blood pressure is less than 120/80. Your blood pressure today was 173/112. Hypertension is treated with lifestyle changes, medicines, or a combination of both. Lifestyle changes include losing weight, eating a healthy, low-sodium diet, exercising more, stop smoking  and limiting alcohol. It is very important to monitor your blood pressure closely. Follow-up with your primary care provider within the next three days for a blood pressure recheck. You may need to be started  on medications if your pressure does not improve.   Go to the ED immediately if you:   Develop a severe headache or confusion. Have unusual weakness or numbness in your face, hands, feet or body  Feel faint or like you're going to pass out  Have severe pain in your chest or abdomen. Vomit repeatedly. Have trouble breathing.     ED Prescriptions   None    PDMP not reviewed this encounter.   Enrique Sack, [place] 05/10/21 [phone]

## 2021-05-10 NOTE — ED Triage Notes (Signed)
Pt reports a insect bite on Rt anterior wrist earlier in week . Pt reports site was red earlier in weak .

## 2021-05-12 LAB — ROCKY MTN SPOTTED FVR ABS PNL(IGG+IGM)
RMSF IgG: NEGATIVE
RMSF IgM: 0.39 index (ref 0.00–0.89)

## 2021-05-12 LAB — LYME DISEASE SEROLOGY W/REFLEX: Lyme Total Antibody EIA: NEGATIVE

## 2021-06-12 ENCOUNTER — Ambulatory Visit (HOSPITAL_COMMUNITY)
Admission: EM | Admit: 2021-06-12 | Discharge: 2021-06-12 | Disposition: A | Discharge status: Home / Self Care | Payer: Self-pay | Attending: Urgent Care | Admitting: Urgent Care

## 2021-06-12 ENCOUNTER — Encounter (HOSPITAL_COMMUNITY): Payer: Self-pay

## 2021-06-12 ENCOUNTER — Other Ambulatory Visit: Payer: Self-pay

## 2021-06-12 DIAGNOSIS — R0982 Postnasal drip: Secondary | ICD-10-CM | POA: Insufficient documentation

## 2021-06-12 DIAGNOSIS — I10 Essential (primary) hypertension: Secondary | ICD-10-CM | POA: Insufficient documentation

## 2021-06-12 DIAGNOSIS — J029 Acute pharyngitis, unspecified: Secondary | ICD-10-CM | POA: Insufficient documentation

## 2021-06-12 DIAGNOSIS — R12 Heartburn: Secondary | ICD-10-CM | POA: Insufficient documentation

## 2021-06-12 MED ORDER — LIDOCAINE VISCOUS HCL 2 % MT SOLN: 5.0000 mL | Freq: Three times a day (TID) | OROMUCOSAL | 0 refills | Status: DC | PRN

## 2021-06-12 MED ORDER — FLUTICASONE PROPIONATE 50 MCG/ACT NA SUSP: 1.0000 | Freq: Two times a day (BID) | NASAL | 0 refills | Status: DC

## 2021-06-12 MED ORDER — PANTOPRAZOLE SODIUM 20 MG PO TBEC: 20.0000 mg | DELAYED_RELEASE_TABLET | Freq: Every day | ORAL | 0 refills | Status: DC

## 2021-06-12 NOTE — Discharge Instructions (Addendum)
Take pantoprazole daily on an empty stomach first thing in the morning. If symptoms persist, it would be recommended to see a gastroenterologist for an EGD to make sure you are not developing a problem in your esophagus. May try flonase as needed for nasal congestion/ post nasal drainage. Swish and swallow the medication as needed for throat pain. Establish care with a primary care physician to discuss your blood pressure. Try to cut back on smoking. Cut back on salt intake. Look at food labels and try not to eat more than 2g of sodium daily.

## 2021-06-12 NOTE — ED Provider Notes (Signed)
[facility]    CSN: [id number] Arrival date & time: 06/12/21  1056      History   Chief Complaint Chief Complaint  Patient presents with   Sore Throat    HPI Scott Vance is a 55 y.o. male.   Pleasant 55 year old male presents today with concerns regarding a sore throat for the past 3 weeks.  He denies any upper respiratory symptoms-no sinus pressure, ear pain, sneezing, coughing.  He denies any fever.  He has had no sick contacts.  He states that the pain in his throat feels much deeper into his esophagus/neck.  Denies any lymphadenopathy and denies dysphagia or odynophagia.  He denies any tenderness to his thyroid.  He does have a longstanding history of GERD for which he takes over-the-counter Tums.  He also is a long-term smoker.  He states he has no prescribed medications and is not under the care of any physician.  Patient denies any additional complaints today.   Sore Throat   Past Medical History:  Diagnosis Date   Asthma     There are no problems to display for this patient.   History reviewed. No pertinent surgical history.     Home Medications    Prior to Admission medications   Medication Sig Start Date End Date Taking? Authorizing Provider  fluticasone (FLONASE) 50 MCG/ACT nasal spray Place 1 spray into both nostrils in the morning and at bedtime. 06/12/21  Yes Chong Wojdyla L, PA  magic mouthwash (lidocaine, diphenhydrAMINE, alum & mag hydroxide) suspension Swish and swallow 5 mLs 3 (three) times daily as needed (throat pain). 06/12/21  Yes Tennelle Taflinger L, PA  pantoprazole (PROTONIX) 20 MG tablet Take 1 tablet (20 mg total) by mouth daily. 06/12/21 07/12/21 Yes Hartman Minahan L, PA  dicyclomine (BENTYL) 20 MG tablet Take 1 tablet (20 mg total) by mouth 2 (two) times daily. Patient not taking: Reported on 12/14/2018 10/15/17 03/20/19  Margarita Mail, PA-C  famotidine (PEPCID) 20 MG tablet Take 1 tablet (20 mg total) by mouth 2 (two) times  daily. Patient not taking: Reported on 12/14/2018 10/15/17 03/20/19  Margarita Mail, PA-C  simethicone (MYLICON) 80 MG chewable tablet Chew 80 mg by mouth every 6 (six) hours as needed for flatulence.  03/20/19  [provider]  sucralfate (CARAFATE) 1 g tablet Take 1 tablet (1 g total) by mouth 3 (three) times daily. Patient not taking: Reported on 12/14/2018 10/15/17 03/20/19  Margarita Mail, PA-C    Family History History reviewed. No pertinent family history.  Social History Social History   Tobacco Use   Smoking status: Some Days    Types: Cigarettes   Smokeless tobacco: Never  Vaping Use   Vaping Use: Never used  Substance Use Topics   Alcohol use: No   Drug use: No     Allergies   Patient has no known allergies.   Review of Systems Review of Systems  HENT:  Positive for sore throat.   Gastrointestinal:        Heartburn  All other systems reviewed and are negative.   Physical Exam Triage Vital Signs ED Triage Vitals [06/12/21 1147]  Enc Vitals Group     BP (!) 166/104     Pulse Rate 81     Resp 18     Temp 97.8 F (36.6 C)     Temp Source Oral     SpO2 99 %     Weight      Height  Head Circumference      Peak Flow      Pain Score 9     Pain Loc      Pain Edu?      Excl. in [place]?    No data found.  Updated Vital Signs BP (!) 166/104 (BP Location: Right Arm)    Pulse 81    Temp 97.8 F (36.6 C) (Oral)    Resp 18    SpO2 99%   Visual Acuity Right Eye Distance:   Left Eye Distance:   Bilateral Distance:    Right Eye Near:   Left Eye Near:    Bilateral Near:     Physical Exam Vitals and nursing note reviewed.  Constitutional:      Appearance: He is well-developed. He is obese. He is not ill-appearing or toxic-appearing.  HENT:     Head: Normocephalic and atraumatic.     Right Ear: Tympanic membrane and ear canal normal. No drainage, swelling or tenderness. No middle ear effusion. Tympanic membrane is not erythematous.     Left Ear:  Tympanic membrane and ear canal normal. No drainage, swelling or tenderness.  No middle ear effusion. Tympanic membrane is not erythematous.     Nose: Rhinorrhea (posterior rhinorrhea noted) present. No congestion.     Mouth/Throat:     Mouth: Mucous membranes are moist. No oral lesions.     Pharynx: Uvula midline. Posterior oropharyngeal erythema (mild erythema to soft palate) present. No pharyngeal swelling, oropharyngeal exudate or uvula swelling.     Tonsils: No tonsillar exudate or tonsillar abscesses.  Eyes:     Extraocular Movements:     Right eye: Normal extraocular motion.     Left eye: Normal extraocular motion.     Conjunctiva/sclera: Conjunctivae normal.     Pupils: Pupils are equal, round, and reactive to light.  Neck:     Thyroid: No thyromegaly.  Cardiovascular:     Rate and Rhythm: Normal rate.     Heart sounds: Normal heart sounds. No murmur heard. Pulmonary:     Effort: Pulmonary effort is normal. No respiratory distress.     Breath sounds: Normal breath sounds. No stridor. No wheezing, rhonchi or rales.  Chest:     Chest wall: No tenderness.  Abdominal:     Palpations: Abdomen is soft.  Musculoskeletal:     Cervical back: Normal range of motion and neck supple.  Lymphadenopathy:     Cervical: No cervical adenopathy.  Skin:    General: Skin is warm.     Coloration: Skin is not pale.     Findings: No erythema or rash.  Neurological:     General: No focal deficit present.     Mental Status: He is alert.     UC Treatments / Results  Labs (all labs ordered are listed, but only abnormal results are displayed) Labs Reviewed  CULTURE, GROUP A STREP [facility])    EKG   Radiology No results found.  Procedures Procedures (including critical care time)  Medications Ordered in UC Medications - No data to display  Initial Impression / Assessment and Plan / UC Course  I have reviewed the triage vital signs and the nursing notes.  Pertinent labs & imaging  results that were available during my care of the patient were reviewed by me and considered in my medical decision making (see chart for details).     Sore throat -given timeframe of symptoms, do not suspect this to be viral nor bacterial.  Patient  requesting throat culture, will send out per request.  Clinically suspect GERD with possible esophagitis. Heartburn-patient has been treating with over-the-counter medications.  We will have him stop and try pantoprazole instead.  Recommended patient establish with a gastroenterologist for possible EGD given the timeframe of his symptoms and concern for developing esophagitis. Postnasal drainage -mild drainage noted on exam.  Will do trial of Flonase, stop if any irritation occurs. Essential hypertension -patient not on medications and not under the care of any physician.  Discussed dietary changes and decrease salt intake.  PCP Assistance placed for management  Final Clinical Impressions(s) / UC Diagnoses   Final diagnoses:  Sore throat  Heartburn  Post-nasal drainage  Essential hypertension     Discharge Instructions      Take pantoprazole daily on an empty stomach first thing in the morning. If symptoms persist, it would be recommended to see a gastroenterologist for an EGD to make sure you are not developing a problem in your esophagus. May try flonase as needed for nasal congestion/ post nasal drainage. Swish and swallow the medication as needed for throat pain. Establish care with a primary care physician to discuss your blood pressure. Try to cut back on smoking. Cut back on salt intake. Look at food labels and try not to eat more than 2g of sodium daily.      ED Prescriptions     Medication Sig Dispense Auth. Provider   pantoprazole (PROTONIX) 20 MG tablet Take 1 tablet (20 mg total) by mouth daily. 30 tablet Alaiyah Bollman L, PA   fluticasone (FLONASE) 50 MCG/ACT nasal spray Place 1 spray into both nostrils in the morning and  at bedtime. 16 mL Novaleigh Kohlman L, PA   magic mouthwash (lidocaine, diphenhydrAMINE, alum & mag hydroxide) suspension Swish and swallow 5 mLs 3 (three) times daily as needed (throat pain). 360 mL Akeelah Seppala L, PA      PDMP not reviewed this encounter.   Maretta Bees, [place] 06/12/21 1312

## 2021-06-12 NOTE — ED Triage Notes (Signed)
Pt c/o sore throat x3 wks. States hx of acid reflux.

## 2021-06-14 LAB — CULTURE, GROUP A STREP (THRC)

## 2021-07-01 ENCOUNTER — Other Ambulatory Visit: Payer: Self-pay

## 2021-07-01 ENCOUNTER — Ambulatory Visit (HOSPITAL_COMMUNITY)
Admission: EM | Admit: 2021-07-01 | Discharge: 2021-07-01 | Disposition: A | Discharge status: Home / Self Care | Payer: Self-pay | Attending: Emergency Medicine | Admitting: Emergency Medicine

## 2021-07-01 ENCOUNTER — Ambulatory Visit (INDEPENDENT_AMBULATORY_CARE_PROVIDER_SITE_OTHER): Payer: Self-pay

## 2021-07-01 ENCOUNTER — Encounter (HOSPITAL_COMMUNITY): Payer: Self-pay | Admitting: Emergency Medicine

## 2021-07-01 DIAGNOSIS — M7731 Calcaneal spur, right foot: Secondary | ICD-10-CM

## 2021-07-01 NOTE — ED Triage Notes (Signed)
Pt reports for about 2 weeks having tingling in right foot and itching. Denies injuries.

## 2021-07-01 NOTE — ED Provider Notes (Signed)
[place]    CSN: [id number] Arrival date & time: 07/01/21  1759    HISTORY   Chief Complaint  Patient presents with   Foot Pain   HPI Scott Vance is a 56 y.o. male. Pt reports for about 2 weeks having tingling in right foot and itching. Denies injury to foot.  EMR reviewed, patient had an x-ray of his right foot done in May and was found to have a bone spur.  Patient states has not tried anything to relieve his symptoms.  Patient states he has never had feel as before.  The history is provided by the patient.  Past Medical History:  Diagnosis Date   Asthma    There are no problems to display for this patient.  History reviewed. No pertinent surgical history.  Home Medications    Prior to Admission medications   Medication Sig Start Date End Date Taking? Authorizing Provider  fluticasone (FLONASE) 50 MCG/ACT nasal spray Place 1 spray into both nostrils in the morning and at bedtime. 06/12/21   Crain, Loree Fee L, PA  magic mouthwash (lidocaine, diphenhydrAMINE, alum & mag hydroxide) suspension Swish and swallow 5 mLs 3 (three) times daily as needed (throat pain). 06/12/21   Crain, Whitney L, PA  pantoprazole (PROTONIX) 20 MG tablet Take 1 tablet (20 mg total) by mouth daily. 06/12/21 07/12/21  Crain, Loree Fee L, PA  dicyclomine (BENTYL) 20 MG tablet Take 1 tablet (20 mg total) by mouth 2 (two) times daily. Patient not taking: Reported on 12/14/2018 10/15/17 03/20/19  Margarita Mail, PA-C  famotidine (PEPCID) 20 MG tablet Take 1 tablet (20 mg total) by mouth 2 (two) times daily. Patient not taking: Reported on 12/14/2018 10/15/17 03/20/19  Margarita Mail, PA-C  simethicone (MYLICON) 80 MG chewable tablet Chew 80 mg by mouth every 6 (six) hours as needed for flatulence.  03/20/19  [provider]  sucralfate (CARAFATE) 1 g tablet Take 1 tablet (1 g total) by mouth 3 (three) times daily. Patient not taking: Reported on 12/14/2018 10/15/17 03/20/19  Margarita Mail,  PA-C    Family History No family history on file. Social History Social History   Tobacco Use   Smoking status: Some Days    Types: Cigarettes   Smokeless tobacco: Never  Vaping Use   Vaping Use: Never used  Substance Use Topics   Alcohol use: No   Drug use: No   Allergies   Patient has no known allergies.  Review of Systems Review of Systems Pertinent findings noted in history of present illness.   Physical Exam Triage Vital Signs ED Triage Vitals  Enc Vitals Group     BP 04/17/21 0827 (!) 147/82     Pulse Rate 04/17/21 0827 72     Resp 04/17/21 0827 18     Temp 04/17/21 0827 98.3 F (36.8 C)     Temp Source 04/17/21 0827 Oral     SpO2 04/17/21 0827 98 %     Weight --      Height --      Head Circumference --      Peak Flow --      Pain Score 04/17/21 0826 5     Pain Loc --      Pain Edu? --      Excl. in [place]? --   No data found.  Updated Vital Signs BP (!) 168/114 (BP Location: Right Arm)    Pulse 97    Temp 98.7 F (37.1 C) (Oral)  Resp 18    SpO2 96%   Physical Exam Vitals and nursing note reviewed.  Constitutional:      General: He is not in acute distress.    Appearance: Normal appearance. He is not ill-appearing.  HENT:     Head: Normocephalic and atraumatic.  Eyes:     General: Lids are normal.        Right eye: No discharge.        Left eye: No discharge.     Extraocular Movements: Extraocular movements intact.     Conjunctiva/sclera: Conjunctivae normal.     Right eye: Right conjunctiva is not injected.     Left eye: Left conjunctiva is not injected.  Neck:     Trachea: Trachea and phonation normal.  Cardiovascular:     Rate and Rhythm: Normal rate and regular rhythm.     Pulses: Normal pulses.     Heart sounds: Normal heart sounds. No murmur heard.   No friction rub. No gallop.  Pulmonary:     Effort: Pulmonary effort is normal. No accessory muscle usage, prolonged expiration or respiratory distress.     Breath sounds: Normal  breath sounds. No stridor, decreased air movement or transmitted upper airway sounds. No decreased breath sounds, wheezing, rhonchi or rales.  Chest:     Chest wall: No tenderness.  Musculoskeletal:        General: No swelling, tenderness, deformity or signs of injury. Normal range of motion.     Cervical back: Normal range of motion and neck supple. Normal range of motion.     Right lower leg: No edema.     Left lower leg: No edema.  Lymphadenopathy:     Cervical: No cervical adenopathy.  Skin:    General: Skin is warm and dry.     Findings: No bruising, erythema, lesion or rash.  Neurological:     General: No focal deficit present.     Mental Status: He is alert and oriented to person, place, and time.  Psychiatric:        Mood and Affect: Mood normal.        Behavior: Behavior normal.    Visual Acuity Right Eye Distance:   Left Eye Distance:   Bilateral Distance:    Right Eye Near:   Left Eye Near:    Bilateral Near:     UC Couse / Diagnostics / Procedures:    EKG  Radiology DG Foot Complete Right  Result Date: 07/01/2021 CLINICAL DATA:  Burning and tingling in foot EXAM: RIGHT FOOT COMPLETE - 3+ VIEW COMPARISON:  None. FINDINGS: There is no evidence of fracture or dislocation. There is no evidence of arthropathy or other focal bone abnormality. Plantar and Achilles calcaneal enthesophytes. Soft tissues are unremarkable. IMPRESSION: Negative. Electronically Signed   By: Merilyn Baba M.D.   On: 07/01/2021 18:41    Procedures Procedures (including critical care time)  UC Diagnoses / Final Clinical Impressions(s)   I have reviewed the triage vital signs and the nursing notes.  Pertinent labs & imaging results that were available during my care of the patient were reviewed by me and considered in my medical decision making (see chart for details).    Final diagnoses:  Bone spur of posterior portion of right calcaneus  Bone spur of inferior portion of right calcaneus    Patient has 2 calcaneal bone spurs on the right.  Patient advised this is the cause of the tingling sensation in his foot.  Patient advised  to follow-up with podiatry.  Patient also advised to wear comfortable, gel soled shoes.  ED Prescriptions   None    PDMP not reviewed this encounter.  Pending results:  Labs Reviewed - No data to display  Medications Ordered in UC: Medications - No data to display  Disposition Upon Discharge:  Condition: stable for discharge home Home: take medications as prescribed; routine discharge instructions as discussed; follow up as advised.  Patient presented with an acute illness with associated systemic symptoms and significant discomfort requiring urgent management. In my opinion, this is a condition that a prudent lay person (someone who possesses an average knowledge of health and medicine) may potentially expect to result in complications if not addressed urgently such as respiratory distress, impairment of bodily function or dysfunction of bodily organs.   Routine symptom specific, illness specific and/or disease specific instructions were discussed with the patient and/or caregiver at length.   As such, the patient has been evaluated and assessed, work-up was performed and treatment was provided in alignment with urgent care protocols and evidence based medicine.  Patient/parent/caregiver has been advised that the patient may require follow up for further testing and treatment if the symptoms continue in spite of treatment, as clinically indicated and appropriate.  If the patient was tested for COVID-19, Influenza and/or RSV, then the patient/parent/guardian was advised to isolate at home pending the results of his/her diagnostic coronavirus test and potentially longer if theyre positive. I have also advised pt that if his/her COVID-19 test returns positive, it's recommended to self-isolate for at least 10 days after symptoms first appeared AND until  fever-free for 24 hours without fever reducer AND other symptoms have improved or resolved. Discussed self-isolation recommendations as well as instructions for household member/close contacts as per the [facility] and [place] [facility], and also gave patient the [facility] packet with this information.  Patient/parent/caregiver has been advised to return to the [facility] or PCP in 3-5 days if no better; to PCP or the Emergency Department if new signs and symptoms develop, or if the current signs or symptoms continue to change or worsen for further workup, evaluation and treatment as clinically indicated and appropriate  The patient will follow up with their current PCP if and as advised. If the patient does not currently have a PCP we will assist them in obtaining one.   The patient may need specialty follow up if the symptoms continue, in spite of conservative treatment and management, for further workup, evaluation, consultation and treatment as clinically indicated and appropriate.   Patient/parent/caregiver verbalized understanding and agreement of plan as discussed.  All questions were addressed during visit.  Please see discharge instructions below for further details of plan.  Discharge Instructions:   Discharge Instructions      The tingling and burning sensation in your foot is caused by bone spurs in two separate locations on your right heel bone.  I recommend that you wear soft soled shoes that have a comfortable gel insert for relief of pressure on your heel.  Recommend that you follow-up with the podiatrist if your symptoms worsen for further evaluation and management..      This office note has been dictated using Dragon speech recognition software.  Unfortunately, and despite my best efforts, this method of dictation can sometimes lead to occasional typographical or grammatical errors.  I apologize in advance if this occurs.     Lynden Oxford Scales, PA-C 07/01/21 1858

## 2021-07-01 NOTE — Discharge Instructions (Addendum)
The tingling and burning sensation in your foot is caused by bone spurs in two separate locations on your right heel bone.  I recommend that you wear soft soled shoes that have a comfortable gel insert for relief of pressure on your heel.  Recommend that you follow-up with the podiatrist if your symptoms worsen for further evaluation and management.Marland Kitchen

## 2021-07-06 ENCOUNTER — Ambulatory Visit
Admission: EM | Admit: 2021-07-06 | Discharge: 2021-07-06 | Disposition: A | Discharge status: Home / Self Care | Payer: Self-pay

## 2021-07-06 ENCOUNTER — Other Ambulatory Visit: Payer: Self-pay

## 2021-09-19 ENCOUNTER — Encounter (HOSPITAL_COMMUNITY): Payer: Self-pay | Admitting: Emergency Medicine

## 2021-09-19 ENCOUNTER — Ambulatory Visit (HOSPITAL_COMMUNITY)
Admission: EM | Admit: 2021-09-19 | Discharge: 2021-09-19 | Disposition: A | Discharge status: Home / Self Care | Payer: Self-pay | Attending: Nurse Practitioner | Admitting: Nurse Practitioner

## 2021-09-19 ENCOUNTER — Ambulatory Visit (INDEPENDENT_AMBULATORY_CARE_PROVIDER_SITE_OTHER): Payer: Self-pay

## 2021-09-19 ENCOUNTER — Other Ambulatory Visit: Payer: Self-pay

## 2021-09-19 ENCOUNTER — Emergency Department (HOSPITAL_COMMUNITY)
Admission: EM | Admit: 2021-09-19 | Discharge: 2021-09-19 | Disposition: A | Discharge status: Home / Self Care | Payer: Self-pay | Attending: Emergency Medicine | Admitting: Emergency Medicine

## 2021-09-19 ENCOUNTER — Emergency Department (HOSPITAL_COMMUNITY): Payer: Self-pay

## 2021-09-19 ENCOUNTER — Encounter (HOSPITAL_COMMUNITY): Payer: Self-pay | Admitting: *Deleted

## 2021-09-19 DIAGNOSIS — Z20822 Contact with and (suspected) exposure to covid-19: Secondary | ICD-10-CM | POA: Insufficient documentation

## 2021-09-19 DIAGNOSIS — F17210 Nicotine dependence, cigarettes, uncomplicated: Secondary | ICD-10-CM | POA: Insufficient documentation

## 2021-09-19 DIAGNOSIS — Z79899 Other long term (current) drug therapy: Secondary | ICD-10-CM | POA: Insufficient documentation

## 2021-09-19 DIAGNOSIS — F17200 Nicotine dependence, unspecified, uncomplicated: Secondary | ICD-10-CM | POA: Insufficient documentation

## 2021-09-19 DIAGNOSIS — I10 Essential (primary) hypertension: Secondary | ICD-10-CM | POA: Insufficient documentation

## 2021-09-19 DIAGNOSIS — R0602 Shortness of breath: Secondary | ICD-10-CM | POA: Insufficient documentation

## 2021-09-19 DIAGNOSIS — R079 Chest pain, unspecified: Secondary | ICD-10-CM

## 2021-09-19 DIAGNOSIS — R0789 Other chest pain: Secondary | ICD-10-CM | POA: Insufficient documentation

## 2021-09-19 LAB — CBC
HCT: 45.1 % (ref 39.0–52.0)
Hemoglobin: 16.1 g/dL (ref 13.0–17.0)
MCH: 33.9 pg (ref 26.0–34.0)
MCHC: 35.7 g/dL (ref 30.0–36.0)
MCV: 94.9 fL (ref 80.0–100.0)
Platelets: 190 10*3/uL (ref 150–400)
RBC: 4.75 MIL/uL (ref 4.22–5.81)
RDW: 12.6 % (ref 11.5–15.5)
WBC: 3 10*3/uL — ABNORMAL LOW (ref 4.0–10.5)
nRBC: 0 % (ref 0.0–0.2)

## 2021-09-19 LAB — TROPONIN I (HIGH SENSITIVITY)
Troponin I (High Sensitivity): 13 ng/L (ref <18)
Troponin I (High Sensitivity): 14 ng/L (ref <18)

## 2021-09-19 LAB — BASIC METABOLIC PANEL
Anion gap: 6 (ref 5–15)
BUN: 10 mg/dL (ref 6–20)
CO2: 26 mmol/L (ref 22–32)
Calcium: 8.8 mg/dL — ABNORMAL LOW (ref 8.9–10.3)
Chloride: 105 mmol/L (ref 98–111)
Creatinine, Ser: 1.08 mg/dL (ref 0.61–1.24)
GFR, Estimated: >60 mL/min (ref >60)
Glucose, Bld: 84 mg/dL (ref 70–99)
Potassium: 4 mmol/L (ref 3.5–5.1)
Sodium: 137 mmol/L (ref 135–145)

## 2021-09-19 MED ORDER — AMLODIPINE BESYLATE 5 MG PO TABS
5.0000 mg | ORAL_TABLET | Freq: Once | ORAL | Status: AC
  Administered 2021-09-19: 5 mg via ORAL
  Filled 2021-09-19: qty 1

## 2021-09-19 MED ORDER — AMLODIPINE BESYLATE 10 MG PO TABS: 10.0000 mg | ORAL_TABLET | Freq: Every day | ORAL | 0 refills | Status: DC

## 2021-09-19 MED ORDER — ALUM & MAG HYDROXIDE-SIMETH 200-200-20 MG/5ML PO SUSP
30.0000 mL | Freq: Once | ORAL | Status: AC
  Administered 2021-09-19: 30 mL via ORAL
  Filled 2021-09-19: qty 30

## 2021-09-19 MED ORDER — LIDOCAINE VISCOUS HCL 2 % MT SOLN
15.0000 mL | Freq: Once | OROMUCOSAL | Status: AC
  Administered 2021-09-19: 15 mL via ORAL
  Filled 2021-09-19: qty 15

## 2021-09-19 NOTE — Discharge Instructions (Addendum)
Please pick up blood pressure medication and take as prescribed daily ? ?It is very important that you buy a blood pressure cuff to check your blood pressure daily. Attached are additional instructions on how to take your blood pressure and a form to help keep track of it.  ? ?A Child psychotherapist should be contacting you in the next couple of days to help find a primary care provider for you ? ?Return to the ED for any new/worsening symptoms ?

## 2021-09-19 NOTE — ED Provider Notes (Signed)
?[facility] ? ? ? ?CSN: [id number] ?Arrival date & time: 09/19/21  1054 ? ? ?  ? ?History   ?Chief Complaint ?Chief Complaint  ?Patient presents with  ? Muscle Pain  ? ? ?HPI ?Scott Vance is a 56 y.o. male.  ? ?The patient is a 56 year old male who presents with chest tightness, shortness of breath over the past 3 to 4 days.  Patient states "I feel like there is something in my chest and that my chest is "beating".  He states that he also has intermittent shortness of breath, depending on what he is doing.  He also states that his chest feels "tight", which he feels is contributing to his shortness of breath.  He does admit to bilateral chest pain, denies any radiation of pain, nausea, vomiting, diaphoresis, or difficulty breathing.  He currently smokes a half pack of cigarettes daily.  He has been doing this for the past 5 years.  He denies fever, chills, sore throat, headache, wheezing, or difficulty breathing.  He states that he has been taking Mucinex for symptoms.  He states in the past, he has had pneumonia, and his symptoms felt very similar.  He denies any past cardiac or pulmonary history.  He currently does not have a primary care physician. ? ? ? ?Past Medical History:  ?Diagnosis Date  ? Asthma   ? ? ?There are no problems to display for this patient. ? ? ?History reviewed. No pertinent surgical history. ? ? ? ? ?Home Medications   ? ?Prior to Admission medications   ?Medication Sig Start Date End Date Taking? Authorizing Provider  ?fluticasone (FLONASE) 50 MCG/ACT nasal spray Place 1 spray into both nostrils in the morning and at bedtime. 06/12/21   Crain, Whitney L, PA  ?pantoprazole (PROTONIX) 20 MG tablet Take 1 tablet (20 mg total) by mouth daily. 06/12/21 07/12/21  Guy Sandifer L, PA  ?dicyclomine (BENTYL) 20 MG tablet Take 1 tablet (20 mg total) by mouth 2 (two) times daily. ?Patient not taking: Reported on 12/14/2018 10/15/17 03/20/19  Arthor Captain, PA-C  ?famotidine (PEPCID) 20 MG  tablet Take 1 tablet (20 mg total) by mouth 2 (two) times daily. ?Patient not taking: Reported on 12/14/2018 10/15/17 03/20/19  Arthor Captain, PA-C  ?simethicone (MYLICON) 80 MG chewable tablet Chew 80 mg by mouth every 6 (six) hours as needed for flatulence.  03/20/19  [provider]  ?sucralfate (CARAFATE) 1 g tablet Take 1 tablet (1 g total) by mouth 3 (three) times daily. ?Patient not taking: Reported on 12/14/2018 10/15/17 03/20/19  Arthor Captain, PA-C  ? ? ?Family History ?History reviewed. No pertinent family history. ? ?Social History ?Social History  ? ?Tobacco Use  ? Smoking status: Some Days  ?  Types: Cigarettes  ? Smokeless tobacco: Never  ?Vaping Use  ? Vaping Use: Never used  ?Substance Use Topics  ? Alcohol use: No  ? Drug use: No  ? ? ? ?Allergies   ?Patient has no known allergies. ? ? ?Review of Systems ?Review of Systems  ?Constitutional: Negative.   ?HENT:  Positive for congestion. Negative for ear pain and sore throat.   ?Eyes: Negative.   ?Respiratory:  Positive for cough, chest tightness and shortness of breath. Negative for wheezing.   ?Cardiovascular: Negative.   ?Gastrointestinal: Negative.   ?Skin: Negative.   ?Psychiatric/Behavioral: Negative.    ? ? ?Physical Exam ?Triage Vital Signs ?ED Triage Vitals  ?Enc Vitals Group  ?   BP 09/19/21 1255 (!)  155/89  ?   Pulse Rate 09/19/21 1255 75  ?   Resp 09/19/21 1255 18  ?   Temp --   ?   Temp src --   ?   SpO2 09/19/21 1255 96 %  ?   Weight --   ?   Height --   ?   Head Circumference --   ?   Peak Flow --   ?   Pain Score 09/19/21 1222 8  ?   Pain Loc --   ?   Pain Edu? --   ?   Excl. in [place]? --   ? ?No data found. ? ?Updated Vital Signs ?BP (!) 155/89   Pulse 75   Resp 18   SpO2 96%  ? ?Visual Acuity ?Right Eye Distance:   ?Left Eye Distance:   ?Bilateral Distance:   ? ?Right Eye Near:   ?Left Eye Near:    ?Bilateral Near:    ? ?Physical Exam ?Vitals reviewed.  ?Constitutional:   ?   General: He is not in acute distress. ?    Appearance: Normal appearance.  ?HENT:  ?   Head: Normocephalic and atraumatic.  ?   Right Ear: Tympanic membrane, ear canal and external ear normal.  ?   Left Ear: Tympanic membrane, ear canal and external ear normal.  ?   Nose: Congestion present.  ?   Mouth/Throat:  ?   Mouth: Mucous membranes are moist.  ?Eyes:  ?   Extraocular Movements: Extraocular movements intact.  ?   Conjunctiva/sclera: Conjunctivae normal.  ?   Pupils: Pupils are equal, round, and reactive to light.  ?Cardiovascular:  ?   Rate and Rhythm: Normal rate and regular rhythm.  ?   Pulses: Normal pulses.  ?   Heart sounds: Normal heart sounds.  ?Pulmonary:  ?   Effort: Pulmonary effort is normal.  ?   Breath sounds: Normal breath sounds.  ?Abdominal:  ?   General: Bowel sounds are normal.  ?   Palpations: Abdomen is soft.  ?   Tenderness: There is no abdominal tenderness.  ?Musculoskeletal:  ?   Cervical back: Normal range of motion. No tenderness.  ?Skin: ?   General: Skin is warm and dry.  ?Neurological:  ?   Mental Status: He is alert and oriented to person, place, and time.  ?Psychiatric:     ?   Mood and Affect: Mood normal.     ?   Behavior: Behavior normal.  ? ? ? ?UC Treatments / Results  ?Labs ?(all labs ordered are listed, but only abnormal results are displayed) ?Labs Reviewed  ?SARS CORONAVIRUS 2 (TAT 6-24 HRS)  ? ? ?EKG ? ? ?Radiology ?DG Chest 2 View ? ?Result Date: 09/19/2021 ?CLINICAL DATA:  Chest pain EXAM: CHEST - 2 VIEW COMPARISON:  12/14/2018 FINDINGS: The heart size and mediastinal contours are within normal limits. Both lungs are clear. The visualized skeletal structures are unremarkable. IMPRESSION: No active cardiopulmonary disease. Electronically Signed   By: Ernie AvenaPalani  Rathinasamy M.D.   On: 09/19/2021 13:12   ? ?Procedures ?Procedures (including critical care time) ? ?Medications Ordered in UC ?Medications - No data to display ? ?Initial Impression / Assessment and Plan / UC Course  ?I have reviewed the triage vital signs  and the nursing notes. ? ?Pertinent labs & imaging results that were available during my care of the patient were reviewed by me and considered in my medical decision making (see chart for details). ? ?The  patient is a 56 year old male who presents for complaints of chest tightness for the past 3 to 4 days.  Patient states that he felt like he had pneumonia, as his symptoms were similar to what he had experienced in the past.  His vital signs were stable during triage and while in the clinic..  Chest x-ray and EKG were performed.  There were some T wave changes in leads V5 and 6 on his EKG compared to his EKG in 2020.  Patient also does have a history of reflux, so cannot exclude this diagnoses.  However, I also cannot exclude any cardiac etiology at this time.  Patient was advised that he needed to go to the ER via [facility].  Patient refused stating he had to have his nephew somewhere, and that he would follow-up in the ER later on this afternoon.  Advised patient regarding the doctors associated with not going to the ER at this time.  He verbalizes understanding. ?Final Clinical Impressions(s) / UC Diagnoses  ? ?Final diagnoses:  ?Feeling of chest tightness  ? ? ? ?Discharge Instructions   ? ?  ?You were advised that you need to go to the ER for further evaluation and work-up based on your EKG changes. ? ? ? ? ? ?ED Prescriptions   ?None ?  ? ?PDMP not reviewed this encounter. ?  ?Abran Cantor, NP ?09/19/21 1357 ? ?

## 2021-09-19 NOTE — ED Triage Notes (Addendum)
Pt reports he has had muscle pain for 3-4 days.Pt feels  like he has PNA. Pt reports he has had  PNA in the past. Pt reports feeling like he was congested. ?

## 2021-09-19 NOTE — ED Notes (Signed)
EDP notified of SBP. Per provider, drastically lowering BP is not in the plan of care for this patient d/t chronic elevation, pt will be discharged with medication to help gradually lower blood pressure. Pt will also receive SW consult for outpatient care.  ?

## 2021-09-19 NOTE — ED Provider Notes (Signed)
?[name] EMERGENCY DEPARTMENT ?Provider Note ? ? ?CSN: [id number] ?Arrival date & time: 09/19/21  1750 ? ?  ? ?History ? ?Chief Complaint  ?Patient presents with  ? Chest Pain  ? Shortness of Breath  ? ? ?Scott Vance is a 56 y.o. male who presents to the ED today with complaint of gradual onset, constant, substernal chest heaviness that began 2-3 days ago. Pt also complains of SOB and feels like his chest is congested. He went to UC earlier today, had COVID and flu test collected (currently in process) and had an EKG done which did show nonspecific T wave changes in V5 and V6 compared to EKG in 2020. Pt advised to go to the ED for further evaluation. Pt denies hx of CAD. He is a some day smoker. Denies diaphoresis, nausea, vomiting. No hx DVT/PE. No recent prolonged travel or immobilization. No hemoptysis. No active malignancy. No exogenous hormone use.  ? ?The history is provided by the patient and medical records.  ? ?  ? ?Home Medications ?Prior to Admission medications   ?Medication Sig Start Date End Date Taking? Authorizing Provider  ?amLODipine (NORVASC) 10 MG tablet Take 1 tablet (10 mg total) by mouth daily. 09/19/21 10/19/21 Yes Tanda Rockers, PA-C  ?Ascorbic Acid (VITAMIN C) 1000 MG tablet Take 1,000 mg by mouth daily.   Yes [provider]  ?aspirin EC 81 MG tablet Take 81 mg by mouth daily as needed (blood circulation). Swallow whole.   Yes [provider]  ?diphenhydrAMINE HCl (ALLERGY MED PO) Take 1 tablet by mouth daily as needed (allergies).   Yes [provider]  ?guaiFENesin (MUCUS RELIEF PO) Take 2 tablets by mouth 2 (two) times daily as needed (congestion).   Yes [provider]  ?naproxen sodium (ALEVE) 220 MG tablet Take 220 mg by mouth daily as needed (pain/headache).   Yes [provider]  ?fluticasone (FLONASE) 50 MCG/ACT nasal spray Place 1 spray into both nostrils in the morning and at bedtime. ?Patient not taking: Reported  on 09/19/2021 06/12/21   Guy Sandifer L, PA  ?pantoprazole (PROTONIX) 20 MG tablet Take 1 tablet (20 mg total) by mouth daily. ?Patient not taking: Reported on 09/19/2021 06/12/21 07/12/21  Maretta Bees, PA  ?dicyclomine (BENTYL) 20 MG tablet Take 1 tablet (20 mg total) by mouth 2 (two) times daily. ?Patient not taking: Reported on 12/14/2018 10/15/17 03/20/19  Arthor Captain, PA-C  ?famotidine (PEPCID) 20 MG tablet Take 1 tablet (20 mg total) by mouth 2 (two) times daily. ?Patient not taking: Reported on 12/14/2018 10/15/17 03/20/19  Arthor Captain, PA-C  ?simethicone (MYLICON) 80 MG chewable tablet Chew 80 mg by mouth every 6 (six) hours as needed for flatulence.  03/20/19  [provider]  ?sucralfate (CARAFATE) 1 g tablet Take 1 tablet (1 g total) by mouth 3 (three) times daily. ?Patient not taking: Reported on 12/14/2018 10/15/17 03/20/19  Arthor Captain, PA-C  ?   ? ?Allergies    ?Patient has no known allergies.   ? ?Review of Systems   ?Review of Systems  ?Constitutional:  Negative for chills and fever.  ?HENT:  Negative for congestion and sore throat.   ?Respiratory:  Positive for shortness of breath.   ?Cardiovascular:  Positive for chest pain. Negative for palpitations and leg swelling.  ?Gastrointestinal:  Negative for abdominal pain, nausea and vomiting.  ?All other systems reviewed and are negative. ? ?Physical Exam ?Updated Vital Signs ?BP (!) 183/111 (BP Location: Right Arm)  Pulse 75   Temp 98.2 ?F (36.8 ?C) (Oral)   Resp 18   SpO2 99%  ?Physical Exam ?Vitals and nursing note reviewed.  ?Constitutional:   ?   Appearance: He is not ill-appearing.  ?HENT:  ?   Head: Normocephalic and atraumatic.  ?Eyes:  ?   Conjunctiva/sclera: Conjunctivae normal.  ?Cardiovascular:  ?   Rate and Rhythm: Normal rate and regular rhythm.  ?   Heart sounds: Normal heart sounds.  ?Pulmonary:  ?   Effort: Pulmonary effort is normal.  ?   Breath sounds: Normal breath sounds. No decreased breath sounds, wheezing,  rhonchi or rales.  ?Chest:  ?   Chest wall: No tenderness.  ?Abdominal:  ?   Palpations: Abdomen is soft.  ?   Tenderness: There is no abdominal tenderness.  ?Musculoskeletal:  ?   Cervical back: Neck supple.  ?Skin: ?   General: Skin is warm and dry.  ?Neurological:  ?   Mental Status: He is alert.  ? ? ?ED Results / Procedures / Treatments   ?Labs ?(all labs ordered are listed, but only abnormal results are displayed) ?Labs Reviewed  ?BASIC METABOLIC PANEL - Abnormal; Notable for the following components:  ?    Result Value  ? Calcium 8.8 (*)   ? All other components within normal limits  ?CBC - Abnormal; Notable for the following components:  ? WBC 3.0 (*)   ? All other components within normal limits  ?TROPONIN I (HIGH SENSITIVITY)  ?TROPONIN I (HIGH SENSITIVITY)  ? ? ?EKG ?EKG Interpretation ? ?Date/Time:  Saturday September 19 2021 18:59:09 EDT ?Ventricular Rate:  78 ?PR Interval:  126 ?QRS Duration: 84 ?QT Interval:  386 ?QTC Calculation: 440 ?R Axis:   53 ?Text Interpretation: Normal sinus rhythm Biatrial enlargement Septal infarct , age undetermined T wave abnormality, consider inferolateral ischemia Abnormal ECG When compared with ECG of 14-Dec-2018 09:45, PREVIOUS ECG IS PRESENT No significant change since last tracing Confirmed by Jacalyn LefevreHaviland, Julie [phone]([id number]) on 09/19/2021 8:23:14 PM ? ?Radiology ?DG Chest 2 View ? ?Result Date: 09/19/2021 ?CLINICAL DATA:  Chest pain EXAM: CHEST - 2 VIEW COMPARISON:  09/19/2021 at 1305 hours FINDINGS: The heart size and mediastinal contours are within normal limits. Both lungs are clear. The visualized skeletal structures are unremarkable. IMPRESSION: No active cardiopulmonary disease. Electronically Signed   By: Duanne GuessNicholas  Plundo D.O.   On: 09/19/2021 19:17  ? ?DG Chest 2 View ? ?Result Date: 09/19/2021 ?CLINICAL DATA:  Chest pain EXAM: CHEST - 2 VIEW COMPARISON:  12/14/2018 FINDINGS: The heart size and mediastinal contours are within normal limits. Both lungs are clear. The visualized  skeletal structures are unremarkable. IMPRESSION: No active cardiopulmonary disease. Electronically Signed   By: Ernie AvenaPalani  Rathinasamy M.D.   On: 09/19/2021 13:12   ? ?Procedures ?Procedures  ? ? ?Medications Ordered in ED ?Medications  ?alum & mag hydroxide-simeth (MAALOX/MYLANTA) 200-200-20 MG/5ML suspension 30 mL (30 mLs Oral Given 09/19/21 2131)  ?  And  ?lidocaine (XYLOCAINE) 2 % viscous mouth solution 15 mL (15 mLs Oral Given 09/19/21 2131)  ?amLODipine (NORVASC) tablet 5 mg (5 mg Oral Given 09/19/21 2234)  ? ? ?ED Course/ Medical Decision Making/ A&P ?  ?                        ?Medical Decision Making ?56 year old male who presents to the ED today with complaint of chest pain x2 to 3 days with associated shortness of breath.  Went to  urgent care today and had EKG changes and advised to come to the ED for further evaluation.  On arrival to the ED vitals are stable.  EKG here with same unspecified T wave abnormalities.  He had chest x-ray done without any acute findings including no concern for pneumonia, pleural effusion, ptx.  Initial troponin of 3.  We will plan for repeat.  Patient denies any risk factors for PE or DVT.  Given overall appearance I very low suspicion for dissection.  Denies any URI-like symptoms prior to chest pain starting to suggest myocarditis or pericarditis and would suspect elevation in troponin testing.  He does endorse history of acid reflux, is not on medications for same.  Question of this could be related.  We will provide GI cocktail.  While in the room his blood pressure was also noted to be elevated at 171/105.  No documented history of hypertension.  Was 155 systolic urgent care however pre previous chart review has been much more elevated in the past. Feel pt likely needs to be on BP meds; will start on amlodipine today. TOC consult placed.  ? ?Repeat troponin unchanged. Pt to be discharged home at this time with amlodipine. Good Rx coupon provided for patient to help with cost.  Social work consult placed. They will contact pt to help with PCP needs. Do not feel he needs to stay for TOC at this time. Attending physician Dr. Particia Nearing has seen pt as well and agrees with plan.  ? ?Problem

## 2021-09-19 NOTE — ED Triage Notes (Signed)
Pt reports chest pain and SOB x 4 days.  Denies nausea and vomiting.  States he feels like mucous is stuck in the center of his chest but denies cough.  Sent from [facility]. ?

## 2021-09-19 NOTE — Discharge Instructions (Addendum)
You were advised that you need to go to the ER for further evaluation and work-up based on your EKG changes. ? ?

## 2021-09-20 LAB — SARS CORONAVIRUS 2 (TAT 6-24 HRS): SARS Coronavirus 2: NEGATIVE

## 2022-03-13 ENCOUNTER — Emergency Department (HOSPITAL_BASED_OUTPATIENT_CLINIC_OR_DEPARTMENT_OTHER)
Admission: EM | Admit: 2022-03-13 | Discharge: 2022-03-13 | Disposition: A | Discharge status: Home / Self Care | Payer: Self-pay | Attending: Emergency Medicine | Admitting: Emergency Medicine

## 2022-03-13 ENCOUNTER — Ambulatory Visit
Admission: EM | Admit: 2022-03-13 | Discharge: 2022-03-13 | Disposition: A | Discharge status: Home / Self Care | Payer: Self-pay

## 2022-03-13 ENCOUNTER — Other Ambulatory Visit: Payer: Self-pay

## 2022-03-13 ENCOUNTER — Emergency Department (HOSPITAL_BASED_OUTPATIENT_CLINIC_OR_DEPARTMENT_OTHER): Payer: Self-pay

## 2022-03-13 ENCOUNTER — Encounter (HOSPITAL_BASED_OUTPATIENT_CLINIC_OR_DEPARTMENT_OTHER): Payer: Self-pay | Admitting: Pediatrics

## 2022-03-13 DIAGNOSIS — Z7982 Long term (current) use of aspirin: Secondary | ICD-10-CM | POA: Insufficient documentation

## 2022-03-13 DIAGNOSIS — R519 Headache, unspecified: Secondary | ICD-10-CM | POA: Insufficient documentation

## 2022-03-13 DIAGNOSIS — R1084 Generalized abdominal pain: Secondary | ICD-10-CM

## 2022-03-13 DIAGNOSIS — K529 Noninfective gastroenteritis and colitis, unspecified: Secondary | ICD-10-CM

## 2022-03-13 DIAGNOSIS — Z20822 Contact with and (suspected) exposure to covid-19: Secondary | ICD-10-CM | POA: Insufficient documentation

## 2022-03-13 DIAGNOSIS — R1030 Lower abdominal pain, unspecified: Secondary | ICD-10-CM

## 2022-03-13 LAB — CBC
HCT: 45.4 % (ref 39.0–52.0)
Hemoglobin: 16.3 g/dL (ref 13.0–17.0)
MCH: 33 pg (ref 26.0–34.0)
MCHC: 35.9 g/dL (ref 30.0–36.0)
MCV: 91.9 fL (ref 80.0–100.0)
Platelets: 152 10*3/uL (ref 150–400)
RBC: 4.94 MIL/uL (ref 4.22–5.81)
RDW: 12.7 % (ref 11.5–15.5)
WBC: 5 10*3/uL (ref 4.0–10.5)
nRBC: 0 % (ref 0.0–0.2)

## 2022-03-13 LAB — COMPREHENSIVE METABOLIC PANEL
ALT: 16 U/L (ref 0–44)
AST: 20 U/L (ref 15–41)
Albumin: 4.3 g/dL (ref 3.5–5.0)
Alkaline Phosphatase: 76 U/L (ref 38–126)
Anion gap: 12 (ref 5–15)
BUN: 14 mg/dL (ref 6–20)
CO2: 24 mmol/L (ref 22–32)
Calcium: 8.9 mg/dL (ref 8.9–10.3)
Chloride: 101 mmol/L (ref 98–111)
Creatinine, Ser: 1 mg/dL (ref 0.61–1.24)
GFR, Estimated: >60 mL/min (ref >60)
Glucose, Bld: 140 mg/dL — ABNORMAL HIGH (ref 70–99)
Potassium: 3.3 mmol/L — ABNORMAL LOW (ref 3.5–5.1)
Sodium: 137 mmol/L (ref 135–145)
Total Bilirubin: 0.7 mg/dL (ref 0.3–1.2)
Total Protein: 7.1 g/dL (ref 6.5–8.1)

## 2022-03-13 LAB — URINALYSIS, ROUTINE W REFLEX MICROSCOPIC
Bilirubin Urine: NEGATIVE
Glucose, UA: NEGATIVE mg/dL
Ketones, ur: 15 mg/dL — AB
Leukocytes,Ua: NEGATIVE
Nitrite: NEGATIVE
Protein, ur: 30 mg/dL — AB
Specific Gravity, Urine: 1.036 — ABNORMAL HIGH (ref 1.005–1.030)
pH: 6 (ref 5.0–8.0)

## 2022-03-13 LAB — SARS CORONAVIRUS 2 BY RT PCR: SARS Coronavirus 2 by RT PCR: NEGATIVE

## 2022-03-13 LAB — LIPASE, BLOOD: Lipase: 13 U/L (ref 11–51)

## 2022-03-13 MED ORDER — HYDROMORPHONE HCL 1 MG/ML IJ SOLN
1.0000 mg | Freq: Once | INTRAMUSCULAR | Status: AC
  Administered 2022-03-13: 1 mg via INTRAVENOUS
  Filled 2022-03-13: qty 1

## 2022-03-13 MED ORDER — SODIUM CHLORIDE 0.9 % IV SOLN
1.0000 g | Freq: Once | INTRAVENOUS | Status: AC
  Administered 2022-03-13: 1 g via INTRAVENOUS
  Filled 2022-03-13: qty 10

## 2022-03-13 MED ORDER — LACTATED RINGERS IV BOLUS
1000.0000 mL | Freq: Once | INTRAVENOUS | Status: AC
  Administered 2022-03-13: 1000 mL via INTRAVENOUS

## 2022-03-13 MED ORDER — IOHEXOL 300 MG/ML  SOLN
100.0000 mL | Freq: Once | INTRAMUSCULAR | Status: AC | PRN
  Administered 2022-03-13: 100 mL via INTRAVENOUS

## 2022-03-13 MED ORDER — LACTATED RINGERS IV SOLN: INTRAVENOUS | Status: DC

## 2022-03-13 MED ORDER — OMEPRAZOLE 20 MG PO CPDR: 20.0000 mg | DELAYED_RELEASE_CAPSULE | Freq: Every day | ORAL | 0 refills | Status: DC

## 2022-03-13 MED ORDER — AMOXICILLIN-POT CLAVULANATE 875-125 MG PO TABS: 1.0000 | ORAL_TABLET | Freq: Two times a day (BID) | ORAL | 0 refills | Status: DC

## 2022-03-13 MED ORDER — HYDROCODONE-ACETAMINOPHEN 5-325 MG PO TABS: 1.0000 | ORAL_TABLET | Freq: Four times a day (QID) | ORAL | 0 refills | Status: DC | PRN

## 2022-03-13 MED ORDER — DICYCLOMINE HCL 10 MG PO CAPS
10.0000 mg | ORAL_CAPSULE | Freq: Once | ORAL | Status: AC
  Administered 2022-03-13: 10 mg via ORAL
  Filled 2022-03-13: qty 1

## 2022-03-13 MED ORDER — ALUM & MAG HYDROXIDE-SIMETH 200-200-20 MG/5ML PO SUSP
30.0000 mL | Freq: Once | ORAL | Status: AC
  Administered 2022-03-13: 30 mL via ORAL
  Filled 2022-03-13: qty 30

## 2022-03-13 MED ORDER — METRONIDAZOLE 500 MG/100ML IV SOLN
500.0000 mg | Freq: Once | INTRAVENOUS | Status: AC
  Administered 2022-03-13: 500 mg via INTRAVENOUS
  Filled 2022-03-13: qty 100

## 2022-03-13 MED ORDER — ACETAMINOPHEN 500 MG PO TABS
1000.0000 mg | ORAL_TABLET | Freq: Once | ORAL | Status: AC
  Administered 2022-03-13: 1000 mg via ORAL
  Filled 2022-03-13: qty 2

## 2022-03-13 NOTE — ED Triage Notes (Addendum)
Pt presents to uc with co of abd pain in the ruq and luq. Pt reports attempting simethicone pills at home with minimal improvement. Pt reports pain comes and goes and when it comes on its a 10/10 stabbing pain. Pt is nauseated, denies any vomiting. Pt endorses mild diarrhea. Pain is worse upon movement. Some positions are less painful than other. Pt denies pain when pushing on the affected area.  Pt also endorses chills and has been taking dayquill for that symptoms.

## 2022-03-13 NOTE — ED Triage Notes (Signed)
C/O chills x 3 days along w/ head pain, and then started having abdominal pain and diarrhea;

## 2022-03-13 NOTE — ED Provider Notes (Signed)
[facility]    CSN: [id number] Arrival date & time: 03/13/22  1337      History   Chief Complaint Chief Complaint  Patient presents with   Abdominal Pain    HPI Scott Vance is a 56 y.o. male.   Patient here today for evaluation of diffuse abdominal pain that comes in waves and is a 10/10 at times. He reports pain is stabbing nature. He has tried simethicone without significant improvement. His last BM was today and was normal. He denies any constipation, diarrhea or blood in his stool. He has felt nauseated but has not had any vomiting. Movement seems to worsen pain. He has tried taking dayquil as well, as he has had chills without significant improvement.   The history is provided by the patient.  Abdominal Pain Associated symptoms: chills and nausea   Associated symptoms: no constipation, no diarrhea, no fever and no vomiting     Past Medical History:  Diagnosis Date   Asthma     There are no problems to display for this patient.   No past surgical history on file.     Home Medications    Prior to Admission medications   Medication Sig Start Date End Date Taking? Authorizing Provider  amLODipine (NORVASC) 10 MG tablet Take 1 tablet (10 mg total) by mouth daily. 09/19/21 10/19/21  Eustaquio Maize, PA-C  amoxicillin-clavulanate (AUGMENTIN) 875-125 MG tablet Take 1 tablet by mouth every 12 (twelve) hours. 03/13/22   Charlesetta Shanks, MD  Ascorbic Acid (VITAMIN C) 1000 MG tablet Take 1,000 mg by mouth daily.    [provider]  aspirin EC 81 MG tablet Take 81 mg by mouth daily as needed (blood circulation). Swallow whole.    [provider]  diphenhydrAMINE HCl (ALLERGY MED PO) Take 1 tablet by mouth daily as needed (allergies).    [provider]  fluticasone (FLONASE) 50 MCG/ACT nasal spray Place 1 spray into both nostrils in the morning and at bedtime. Patient not taking: Reported on 09/19/2021 06/12/21   Geryl Councilman L, PA   guaiFENesin (MUCUS RELIEF PO) Take 2 tablets by mouth 2 (two) times daily as needed (congestion).    [provider]  HYDROcodone-acetaminophen (NORCO/VICODIN) 5-325 MG tablet Take 1-2 tablets by mouth every 6 (six) hours as needed for moderate pain or severe pain. 03/13/22   Charlesetta Shanks, MD  naproxen sodium (ALEVE) 220 MG tablet Take 220 mg by mouth daily as needed (pain/headache).    [provider]  omeprazole (PRILOSEC) 20 MG capsule Take 1 capsule (20 mg total) by mouth daily. 03/13/22   Charlesetta Shanks, MD  pantoprazole (PROTONIX) 20 MG tablet Take 1 tablet (20 mg total) by mouth daily. Patient not taking: Reported on 09/19/2021 06/12/21 07/12/21  Geryl Councilman L, PA  dicyclomine (BENTYL) 20 MG tablet Take 1 tablet (20 mg total) by mouth 2 (two) times daily. Patient not taking: Reported on 12/14/2018 10/15/17 03/20/19  Margarita Mail, PA-C  famotidine (PEPCID) 20 MG tablet Take 1 tablet (20 mg total) by mouth 2 (two) times daily. Patient not taking: Reported on 12/14/2018 10/15/17 03/20/19  Margarita Mail, PA-C  simethicone (MYLICON) 80 MG chewable tablet Chew 80 mg by mouth every 6 (six) hours as needed for flatulence.  03/20/19  [provider]  sucralfate (CARAFATE) 1 g tablet Take 1 tablet (1 g total) by mouth 3 (three) times daily. Patient not taking: Reported on 12/14/2018 10/15/17 03/20/19  Margarita Mail, PA-C    Family  History No family history on file.  Social History Social History   Tobacco Use   Smoking status: Some Days    Types: Cigarettes   Smokeless tobacco: Never  Vaping Use   Vaping Use: Never used  Substance Use Topics   Alcohol use: No   Drug use: No     Allergies   Patient has no known allergies.   Review of Systems Review of Systems  Constitutional:  Positive for chills. Negative for fever.  Eyes:  Negative for discharge and redness.  Gastrointestinal:  Positive for abdominal pain and nausea. Negative for blood in stool,  constipation, diarrhea and vomiting.     Physical Exam Triage Vital Signs ED Triage Vitals  Enc Vitals Group     BP 03/13/22 1454 (!) 142/99     Pulse Rate 03/13/22 1454 96     Resp 03/13/22 1454 18     Temp 03/13/22 1454 98.6 F (37 C)     Temp src --      SpO2 03/13/22 1454 99 %     Weight --      Height --      Head Circumference --      Peak Flow --      Pain Score 03/13/22 1452 10     Pain Loc --      Pain Edu? --      Excl. in [place]? --    No data found.  Updated Vital Signs BP (!) 142/99   Pulse 96   Temp 98.6 F (37 C)   Resp 18   SpO2 99%     Physical Exam Vitals and nursing note reviewed.  Constitutional:      General: He is not in acute distress.    Appearance: Normal appearance. He is not ill-appearing.  HENT:     Head: Normocephalic and atraumatic.  Eyes:     Conjunctiva/sclera: Conjunctivae normal.  Cardiovascular:     Rate and Rhythm: Normal rate.  Pulmonary:     Effort: Pulmonary effort is normal. No respiratory distress.  Abdominal:     Comments: Patient unable to sit when he starts experiencing pain- has to stand up and walk around exam room  Neurological:     Mental Status: He is alert.  Psychiatric:        Mood and Affect: Mood normal.        Behavior: Behavior normal.        Thought Content: Thought content normal.      UC Treatments / Results  Labs (all labs ordered are listed, but only abnormal results are displayed) Labs Reviewed - No data to display  EKG   Radiology CT Abdomen Pelvis W Contrast  Result Date: 03/13/2022 CLINICAL DATA:  Left lower quadrant pain EXAM: CT ABDOMEN AND PELVIS WITH CONTRAST TECHNIQUE: Multidetector CT imaging of the abdomen and pelvis was performed using the standard protocol following bolus administration of intravenous contrast. RADIATION DOSE REDUCTION: This exam was performed according to the departmental dose-optimization program which includes automated exposure control, adjustment of the mA  and/or kV according to patient size and/or use of iterative reconstruction technique. CONTRAST:  133mL OMNIPAQUE IOHEXOL 300 MG/ML  SOLN COMPARISON:  12/14/2018 FINDINGS: Lower chest: No acute abnormality Hepatobiliary: No focal hepatic abnormality. Gallbladder unremarkable. Pancreas: No focal abnormality or ductal dilatation. Spleen: No focal abnormality.  Normal size. Adrenals/Urinary Tract: No adrenal abnormality. No focal renal abnormality. No stones or hydronephrosis. Urinary bladder is unremarkable. Stomach/Bowel: Sigmoid diverticulosis. No active  diverticulitis. Normal appendix. Wall thickening in the ascending colon and hepatic flexure with surrounding inflammation compatible with colitis. Remainder of the colon, stomach, small bowel unremarkable. Vascular/Lymphatic: No evidence of aneurysm or adenopathy. Reproductive: No visible focal abnormality. Other: No free fluid or free air. Musculoskeletal: No acute bony abnormality. IMPRESSION: Wall thickening and surrounding inflammation involving the ascending colon and hepatic flexure compatible with infectious or inflammatory colitis. Electronically Signed   By: Rolm Baptise M.D.   On: 03/13/2022 20:20    Procedures Procedures (including critical care time)  Medications Ordered in UC Medications - No data to display  Initial Impression / Assessment and Plan / UC Course  I have reviewed the triage vital signs and the nursing notes.  Pertinent labs & imaging results that were available during my care of the patient were reviewed by me and considered in my medical decision making (see chart for details).   Recommended further evaluation in ED for imaging given significant pain with no obvious explanation. Patient is agreeable to same.    Final Clinical Impressions(s) / UC Diagnoses   Final diagnoses:  Lower abdominal pain     Discharge Instructions       [address]  [place], [place] [address]     ED  Prescriptions   None    PDMP not reviewed this encounter.   Francene Finders, PA-C 03/14/22 0825

## 2022-03-13 NOTE — Discharge Instructions (Signed)
  [address]  [place], [place] [address]

## 2022-03-13 NOTE — ED Notes (Signed)
Patient is being discharged from the Urgent Care and sent to the Emergency Department via POV . Per Ewell Poe PA, patient is in need of higher level of care due to abdominal pain. Patient is aware and verbalizes understanding of plan of care.  Vitals:   03/13/22 1454  BP: (!) 142/99  Pulse: 96  Resp: 18  Temp: 98.6 F (37 C)  SpO2: 99%

## 2022-03-13 NOTE — ED Notes (Signed)
RT educated pt on smoking cessation. Pt verbalizes understanding and teaching.

## 2022-03-13 NOTE — Discharge Instructions (Signed)
1.  You have an infection of the colon called colitis.  Can be different causes for this but it may be an infection and you are being treated with an antibiotic called Augmentin.  Fill your prescription and take as directed.  For additional pain control if needed you have been given a prescription for Vicodin.  This a strong pain medication that contains a narcotic.  You need your pain is improving, transition to over-the-counter Tylenol and stop taking the Vicodin.  Vicodin is addictive and can also cause constipation. 2.  Start taking omeprazole daily for the next 2 weeks. 3.  You need a follow-up appointment to make sure your symptoms are improving and resolving.  Low colitis is often due to infection, there are other serious causes such as cancer or autoimmune diseases.  This is why it is very important that you get a follow-up appointment.  Dr. Erlinda Hong contact information is included in your discharge instructions.  He is a gastroenterologist.  Call the office to schedule a follow-up appointment.  I have also included a resource guide for low-cost medical care.  You should have a family doctor.  Good options are the [facility] internal medicine resident clinic and the [facility] as well as [facility].  The contact information is included in your discharge instructions under the resource guide.

## 2022-03-13 NOTE — ED Provider Notes (Signed)
[facility] EMERGENCY DEPT Provider Note   CSN: [id number] Arrival date & time: 03/13/22  1606     History {Add pertinent medical, surgical, social history, OB history to HPI:1} Chief Complaint  Patient presents with  . Abdominal Pain  . Chills  . Headache    Scott Vance is a 56 y.o. male.  HPI     Home Medications Prior to Admission medications   Medication Sig Start Date End Date Taking? Authorizing Provider  amLODipine (NORVASC) 10 MG tablet Take 1 tablet (10 mg total) by mouth daily. 09/19/21 10/19/21  Tanda Rockers, PA-C  Ascorbic Acid (VITAMIN C) 1000 MG tablet Take 1,000 mg by mouth daily.    [provider]  aspirin EC 81 MG tablet Take 81 mg by mouth daily as needed (blood circulation). Swallow whole.    [provider]  diphenhydrAMINE HCl (ALLERGY MED PO) Take 1 tablet by mouth daily as needed (allergies).    [provider]  fluticasone (FLONASE) 50 MCG/ACT nasal spray Place 1 spray into both nostrils in the morning and at bedtime. Patient not taking: Reported on 09/19/2021 06/12/21   Guy Sandifer L, PA  guaiFENesin (MUCUS RELIEF PO) Take 2 tablets by mouth 2 (two) times daily as needed (congestion).    [provider]  naproxen sodium (ALEVE) 220 MG tablet Take 220 mg by mouth daily as needed (pain/headache).    [provider]  pantoprazole (PROTONIX) 20 MG tablet Take 1 tablet (20 mg total) by mouth daily. Patient not taking: Reported on 09/19/2021 06/12/21 07/12/21  Guy Sandifer L, PA  dicyclomine (BENTYL) 20 MG tablet Take 1 tablet (20 mg total) by mouth 2 (two) times daily. Patient not taking: Reported on 12/14/2018 10/15/17 03/20/19  Arthor Captain, PA-C  famotidine (PEPCID) 20 MG tablet Take 1 tablet (20 mg total) by mouth 2 (two) times daily. Patient not taking: Reported on 12/14/2018 10/15/17 03/20/19  Arthor Captain, PA-C  simethicone (MYLICON) 80 MG chewable tablet Chew 80 mg by mouth every 6 (six)  hours as needed for flatulence.  03/20/19  [provider]  sucralfate (CARAFATE) 1 g tablet Take 1 tablet (1 g total) by mouth 3 (three) times daily. Patient not taking: Reported on 12/14/2018 10/15/17 03/20/19  Arthor Captain, PA-C      Allergies    Patient has no known allergies.    Review of Systems   Review of Systems  Physical Exam Updated Vital Signs BP (!) 144/84   Pulse 84   Temp 99.1 F (37.3 C) (Oral)   Resp 18   Ht 6\' 1"  (1.854 m)   Wt 108.9 kg   SpO2 97%   BMI 31.66 kg/m  Physical Exam  ED Results / Procedures / Treatments   Labs (all labs ordered are listed, but only abnormal results are displayed) Labs Reviewed  COMPREHENSIVE METABOLIC PANEL - Abnormal; Notable for the following components:      Result Value   Potassium 3.3 (*)    Glucose, Bld 140 (*)    All other components within normal limits  URINALYSIS, ROUTINE W REFLEX MICROSCOPIC - Abnormal; Notable for the following components:   Specific Gravity, Urine 1.036 (*)    Hgb urine dipstick MODERATE (*)    Ketones, ur 15 (*)    Protein, ur 30 (*)    All other components within normal limits  SARS CORONAVIRUS 2 BY RT PCR  LIPASE, BLOOD  CBC    EKG None  Radiology CT Abdomen Pelvis W  Contrast  Result Date: 03/13/2022 CLINICAL DATA:  Left lower quadrant pain EXAM: CT ABDOMEN AND PELVIS WITH CONTRAST TECHNIQUE: Multidetector CT imaging of the abdomen and pelvis was performed using the standard protocol following bolus administration of intravenous contrast. RADIATION DOSE REDUCTION: This exam was performed according to the departmental dose-optimization program which includes automated exposure control, adjustment of the mA and/or kV according to patient size and/or use of iterative reconstruction technique. CONTRAST:  183mL OMNIPAQUE IOHEXOL 300 MG/ML  SOLN COMPARISON:  12/14/2018 FINDINGS: Lower chest: No acute abnormality Hepatobiliary: No focal hepatic abnormality. Gallbladder unremarkable.  Pancreas: No focal abnormality or ductal dilatation. Spleen: No focal abnormality.  Normal size. Adrenals/Urinary Tract: No adrenal abnormality. No focal renal abnormality. No stones or hydronephrosis. Urinary bladder is unremarkable. Stomach/Bowel: Sigmoid diverticulosis. No active diverticulitis. Normal appendix. Wall thickening in the ascending colon and hepatic flexure with surrounding inflammation compatible with colitis. Remainder of the colon, stomach, small bowel unremarkable. Vascular/Lymphatic: No evidence of aneurysm or adenopathy. Reproductive: No visible focal abnormality. Other: No free fluid or free air. Musculoskeletal: No acute bony abnormality. IMPRESSION: Wall thickening and surrounding inflammation involving the ascending colon and hepatic flexure compatible with infectious or inflammatory colitis. Electronically Signed   By: Rolm Baptise M.D.   On: 03/13/2022 20:20    Procedures Procedures  {Document cardiac monitor, telemetry assessment procedure when appropriate:1}  Medications Ordered in ED Medications  lactated ringers infusion ( Intravenous New Bag/Given 03/13/22 2108)  acetaminophen (TYLENOL) tablet 1,000 mg (1,000 mg Oral Given 03/13/22 1949)  lactated ringers bolus 1,000 mL (0 mLs Intravenous Stopped 03/13/22 2143)  HYDROmorphone (DILAUDID) injection 1 mg (1 mg Intravenous Given 03/13/22 2023)  iohexol (OMNIPAQUE) 300 MG/ML solution 100 mL (100 mLs Intravenous Contrast Given 03/13/22 2009)  metroNIDAZOLE (FLAGYL) IVPB 500 mg (500 mg Intravenous New Bag/Given 03/13/22 2148)  cefTRIAXone (ROCEPHIN) 1 g in sodium chloride 0.9 % 100 mL IVPB (0 g Intravenous Stopped 03/13/22 2143)  alum & mag hydroxide-simeth (MAALOX/MYLANTA) 200-200-20 MG/5ML suspension 30 mL (30 mLs Oral Given 03/13/22 2118)  dicyclomine (BENTYL) capsule 10 mg (10 mg Oral Given 03/13/22 2118)    ED Course/ Medical Decision Making/ A&P                           Medical Decision Making Amount and/or Complexity  of Data Reviewed Labs: ordered. Radiology: ordered.  Risk OTC drugs. Prescription drug management.   ***  {Document critical care time when appropriate:1} {Document review of labs and clinical decision tools ie heart score, Chads2Vasc2 etc:1}  {Document your independent review of radiology images, and any outside records:1} {Document your discussion with family members, caretakers, and with consultants:1} {Document social determinants of health affecting pt's care:1} {Document your decision making why or why not admission, treatments were needed:1} Final Clinical Impression(s) / ED Diagnoses Final diagnoses:  None    Rx / DC Orders ED Discharge Orders     None

## 2022-05-04 ENCOUNTER — Ambulatory Visit
Admission: EM | Admit: 2022-05-04 | Discharge: 2022-05-04 | Disposition: A | Discharge status: Home / Self Care | Payer: Self-pay | Attending: Physician Assistant | Admitting: Physician Assistant

## 2022-05-04 DIAGNOSIS — H6501 Acute serous otitis media, right ear: Secondary | ICD-10-CM

## 2022-05-04 DIAGNOSIS — I10 Essential (primary) hypertension: Secondary | ICD-10-CM

## 2022-05-04 DIAGNOSIS — J069 Acute upper respiratory infection, unspecified: Secondary | ICD-10-CM

## 2022-05-04 MED ORDER — DM-GUAIFENESIN ER 30-600 MG PO TB12: 1.0000 | ORAL_TABLET | Freq: Two times a day (BID) | ORAL | 0 refills | Status: DC

## 2022-05-04 MED ORDER — FLUTICASONE PROPIONATE 50 MCG/ACT NA SUSP: 2.0000 | Freq: Every day | NASAL | 2 refills | Status: DC

## 2022-05-04 MED ORDER — AZITHROMYCIN 250 MG PO TABS: ORAL_TABLET | ORAL | 0 refills | Status: DC

## 2022-05-04 MED ORDER — AMLODIPINE BESYLATE 5 MG PO TABS: 5.0000 mg | ORAL_TABLET | Freq: Every day | ORAL | 1 refills | Status: DC

## 2022-05-04 NOTE — ED Triage Notes (Signed)
[facility] presents to [facility] with co of otalgia for one week. Pt reports attempting peroxide at home with no improvement.

## 2022-05-04 NOTE — Discharge Instructions (Addendum)
Advise to restart the amlodipine 5 mg, 1 daily to help control blood pressure. Advised to take the Zithromax 2 tablets initially and then 1 daily until completed to treat the ear infection. Advised to use the Flonase nasal spray, 2 sprays each nostril once daily to help decrease your congestion. Advised to continue with Mucinex DM for cough and congestion every 12 hours. Follow-up PCP or return to urgent care as needed.

## 2022-05-04 NOTE — ED Provider Notes (Signed)
[facility]    CSN: [id number] Arrival date & time: 05/04/22  0858      History   Chief Complaint No chief complaint on file.   HPI Scott Vance is a 56 y.o. male.   57 year old male presents with right ear pain, congestion, and high blood pressure.  Patient indicates for the past week has been having persistent cough and respiratory congestion with sinus congestion, rhinitis, postnasal drip and clear production.  Patient also indicates that he has been having right ear discomfort, fullness, feels like he is underwater.  He indicates that he does not have pain but he does experience a lot of pressure of the right ear.  Patient also indicates he has had some chest congestion and cough, no fever or chills.  Patient has been taking OTC Mucinex with mild relief of his symptoms. Patient also indicates that he has not taken his amlodipine blood pressure medicine because he believes that it is too strong.  Advised that his blood pressure is elevated.  Restart his blood pressure medicine and take it on a daily basis.  Patient patient request a lower dose of the blood pressure medicine because he believes that the amlodipine 10 mg was too strong for him.  He indicates he did not have any side effects from the blood pressure medicine when taking several doses.     Past Medical History:  Diagnosis Date   Asthma     There are no problems to display for this patient.   History reviewed. No pertinent surgical history.     Home Medications    Prior to Admission medications   Medication Sig Start Date End Date Taking? Authorizing Provider  amLODipine (NORVASC) 5 MG tablet Take 1 tablet (5 mg total) by mouth daily. 05/04/22  Yes Ellsworth Lennox, PA-C  azithromycin (ZITHROMAX Z-PAK) 250 MG tablet Take 2 initially, then 1 tablet daily until completed. 05/04/22  Yes Ellsworth Lennox, PA-C  dextromethorphan-guaiFENesin [facility] DM) 30-600 MG 12hr tablet Take 1 tablet by mouth 2 (two)  times daily. 05/04/22  Yes Ellsworth Lennox, PA-C  fluticasone [facility]) 50 MCG/ACT nasal spray Place 2 sprays into both nostrils daily. 05/04/22  Yes Ellsworth Lennox, PA-C  amoxicillin-clavulanate (AUGMENTIN) 875-125 MG tablet Take 1 tablet by mouth every 12 (twelve) hours. 03/13/22   Arby Barrette, MD  Ascorbic Acid (VITAMIN C) 1000 MG tablet Take 1,000 mg by mouth daily.    [provider]  aspirin EC 81 MG tablet Take 81 mg by mouth daily as needed (blood circulation). Swallow whole.    [provider]  diphenhydrAMINE HCl (ALLERGY MED PO) Take 1 tablet by mouth daily as needed (allergies).    [provider]  fluticasone (FLONASE) 50 MCG/ACT nasal spray Place 1 spray into both nostrils in the morning and at bedtime. Patient not taking: Reported on 09/19/2021 06/12/21   Guy Sandifer L, PA  guaiFENesin (MUCUS RELIEF PO) Take 2 tablets by mouth 2 (two) times daily as needed (congestion).    [provider]  HYDROcodone-acetaminophen (NORCO/VICODIN) 5-325 MG tablet Take 1-2 tablets by mouth every 6 (six) hours as needed for moderate pain or severe pain. 03/13/22   Arby Barrette, MD  naproxen sodium (ALEVE) 220 MG tablet Take 220 mg by mouth daily as needed (pain/headache).    [provider]  omeprazole (PRILOSEC) 20 MG capsule Take 1 capsule (20 mg total) by mouth daily. 03/13/22   Arby Barrette, MD  pantoprazole (PROTONIX) 20 MG tablet Take 1 tablet (  20 mg total) by mouth daily. Patient not taking: Reported on 09/19/2021 06/12/21 07/12/21  Guy Sandifer L, PA  dicyclomine (BENTYL) 20 MG tablet Take 1 tablet (20 mg total) by mouth 2 (two) times daily. Patient not taking: Reported on 12/14/2018 10/15/17 03/20/19  Arthor Captain, PA-C  famotidine (PEPCID) 20 MG tablet Take 1 tablet (20 mg total) by mouth 2 (two) times daily. Patient not taking: Reported on 12/14/2018 10/15/17 03/20/19  Arthor Captain, PA-C  simethicone (MYLICON) 80 MG chewable tablet Chew 80 mg by  mouth every 6 (six) hours as needed for flatulence.  03/20/19  [provider]  sucralfate (CARAFATE) 1 g tablet Take 1 tablet (1 g total) by mouth 3 (three) times daily. Patient not taking: Reported on 12/14/2018 10/15/17 03/20/19  Arthor Captain, PA-C    Family History History reviewed. No pertinent family history.  Social History Social History   Tobacco Use   Smoking status: Some Days    Types: Cigarettes   Smokeless tobacco: Never  Vaping Use   Vaping Use: Never used  Substance Use Topics   Alcohol use: No   Drug use: No     Allergies   Patient has no known allergies.   Review of Systems Review of Systems  HENT:  Positive for ear pain (right with pressure.), postnasal drip, sinus pressure and sinus pain.      Physical Exam Triage Vital Signs ED Triage Vitals  Enc Vitals Group     BP 05/04/22 0930 (!) 162/101     Pulse Rate 05/04/22 0930 86     Resp 05/04/22 0930 19     Temp 05/04/22 0930 97.7 F (36.5 C)     Temp Source 05/04/22 0930 Oral     SpO2 05/04/22 0930 98 %     Weight --      Height --      Head Circumference --      Peak Flow --      Pain Score 05/04/22 0929 4     Pain Loc --      Pain Edu? --      Excl. in GC? --    No data found.  Updated Vital Signs BP (!) 162/101 (BP Location: Left Arm)   Pulse 86   Temp 97.7 F (36.5 C) (Oral)   Resp 19   SpO2 98%   Visual Acuity Right Eye Distance:   Left Eye Distance:   Bilateral Distance:    Right Eye Near:   Left Eye Near:    Bilateral Near:     Physical Exam Constitutional:      Appearance: Normal appearance.  HENT:     Right Ear: Ear canal normal. A middle ear effusion is present. Tympanic membrane is erythematous.     Left Ear: Tympanic membrane and ear canal normal.     Mouth/Throat:     Mouth: Mucous membranes are moist.     Pharynx: Oropharynx is clear.  Cardiovascular:     Rate and Rhythm: Normal rate and regular rhythm.     Heart sounds: Normal heart sounds.   Pulmonary:     Effort: Pulmonary effort is normal.     Breath sounds: Normal breath sounds and air entry. No wheezing or rhonchi.  Lymphadenopathy:     Cervical: No cervical adenopathy.  Neurological:     Mental Status: He is alert.      UC Treatments / Results  Labs (all labs ordered are listed, but only abnormal results are displayed)  Labs Reviewed - No data to display  EKG   Radiology No results found.  Procedures Procedures (including critical care time)  Medications Ordered in UC Medications - No data to display  Initial Impression / Assessment and Plan / UC Course  I have reviewed the triage vital signs and the nursing notes.  Pertinent labs & imaging results that were available during my care of the patient were reviewed by me and considered in my medical decision making (see chart for details).    Plan: The Hypertension will be treated with: A. Amlodipine 5mg  daily to treat the HTN. 2. The upper respiratory infection will be treated with: A. Flonase nasal spray, 2 sprays each nostril once daily. B. Mucinex DM every 12 hours to control congestion. 3. The right ear infection will be treated with: A. Zithromax 250mg , 2 initially, then 1 daily for next 5 days. 4. Advise to follow-up with PCP or return to UC as needed. Final Clinical Impressions(s) / UC Diagnoses   Final diagnoses:  Essential hypertension  Acute upper respiratory infection  Non-recurrent acute serous otitis media of right ear     Discharge Instructions      Advise to restart the amlodipine 5 mg, 1 daily to help control blood pressure. Advised to take the Zithromax 2 tablets initially and then 1 daily until completed to treat the ear infection. Advised to use the Flonase nasal spray, 2 sprays each nostril once daily to help decrease your congestion. Advised to continue with Mucinex DM for cough and congestion every 12 hours. Follow-up PCP or return to urgent care as needed.     ED  Prescriptions     Medication Sig Dispense Auth. Provider   azithromycin (ZITHROMAX Z-PAK) 250 MG tablet Take 2 initially, then 1 tablet daily until completed. 6 each , PA-C   amLODipine (NORVASC) 5 MG tablet Take 1 tablet (5 mg total) by mouth daily. 90 tablet , PA-C   fluticasone [facility] 50 MCG/ACT nasal spray Place 2 sprays into both nostrils daily. 11.1 g Ellsworth Lennox, PA-C   dextromethorphan-guaiFENesin [facility] DM) 30-600 MG 12hr tablet Take 1 tablet by mouth 2 (two) times daily. 20 tablet Ellsworth Lennox, PA-C      PDMP not reviewed this encounter.   [facility], PA-C 05/04/22 1034

## 2022-07-02 ENCOUNTER — Emergency Department (HOSPITAL_COMMUNITY): Payer: Medicaid Other

## 2022-07-02 ENCOUNTER — Encounter (HOSPITAL_COMMUNITY): Payer: Self-pay

## 2022-07-02 ENCOUNTER — Emergency Department (HOSPITAL_COMMUNITY)
Admission: EM | Admit: 2022-07-02 | Discharge: 2022-07-02 | Disposition: A | Discharge status: Home / Self Care | Payer: Medicaid Other | Attending: Student | Admitting: Student

## 2022-07-02 DIAGNOSIS — S161XXA Strain of muscle, fascia and tendon at neck level, initial encounter: Secondary | ICD-10-CM | POA: Insufficient documentation

## 2022-07-02 DIAGNOSIS — J45909 Unspecified asthma, uncomplicated: Secondary | ICD-10-CM | POA: Diagnosis not present

## 2022-07-02 DIAGNOSIS — Z7951 Long term (current) use of inhaled steroids: Secondary | ICD-10-CM | POA: Insufficient documentation

## 2022-07-02 DIAGNOSIS — M542 Cervicalgia: Secondary | ICD-10-CM | POA: Diagnosis present

## 2022-07-02 MED ORDER — ACETAMINOPHEN 325 MG PO TABS
650.0000 mg | ORAL_TABLET | Freq: Once | ORAL | Status: AC
  Administered 2022-07-02: 650 mg via ORAL
  Filled 2022-07-02: qty 2

## 2022-07-02 MED ORDER — CYCLOBENZAPRINE HCL 10 MG PO TABS: 10.0000 mg | ORAL_TABLET | Freq: Two times a day (BID) | ORAL | 0 refills | Status: DC | PRN
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
## 2022-07-02 NOTE — Discharge Instructions (Signed)
Return to ED with any new symptoms Please follow-up with PCP Please begin taking scheduled ibuprofen and Tylenol.  You may take 600 mg of ibuprofen and then 3 hours later take 650 mg of Tylenol.  Please follow instructions on the back of the packaging. Please take muscle relaxers as needed for pain.  Please be aware that these will sedate you, please only take them at night and do not drive or operate heavy machinery while taking these.
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
## 2022-07-19 ENCOUNTER — Emergency Department (HOSPITAL_BASED_OUTPATIENT_CLINIC_OR_DEPARTMENT_OTHER)
Admission: EM | Admit: 2022-07-19 | Discharge: 2022-07-19 | Disposition: A | Discharge status: Home / Self Care | Payer: Medicaid Other | Attending: Emergency Medicine | Admitting: Emergency Medicine

## 2022-07-19 ENCOUNTER — Other Ambulatory Visit: Payer: Self-pay

## 2022-07-19 ENCOUNTER — Emergency Department (HOSPITAL_BASED_OUTPATIENT_CLINIC_OR_DEPARTMENT_OTHER): Payer: Medicaid Other | Admitting: Radiology

## 2022-07-19 DIAGNOSIS — R9431 Abnormal electrocardiogram [ECG] [EKG]: Secondary | ICD-10-CM | POA: Insufficient documentation

## 2022-07-19 DIAGNOSIS — M79632 Pain in left forearm: Secondary | ICD-10-CM | POA: Diagnosis present

## 2022-07-19 DIAGNOSIS — M25522 Pain in left elbow: Secondary | ICD-10-CM | POA: Insufficient documentation

## 2022-07-19 DIAGNOSIS — R030 Elevated blood-pressure reading, without diagnosis of hypertension: Secondary | ICD-10-CM | POA: Diagnosis not present

## 2022-07-19 LAB — TROPONIN I (HIGH SENSITIVITY)
Troponin I (High Sensitivity): 18 ng/L — ABNORMAL HIGH (ref <18)
Troponin I (High Sensitivity): 19 ng/L — ABNORMAL HIGH (ref <18)

## 2022-07-19 LAB — BASIC METABOLIC PANEL
Anion gap: 5 (ref 5–15)
BUN: 9 mg/dL (ref 6–20)
CO2: 27 mmol/L (ref 22–32)
Calcium: 9.4 mg/dL (ref 8.9–10.3)
Chloride: 105 mmol/L (ref 98–111)
Creatinine, Ser: 0.92 mg/dL (ref 0.61–1.24)
GFR, Estimated: >60 mL/min (ref >60)
Glucose, Bld: 101 mg/dL — ABNORMAL HIGH (ref 70–99)
Potassium: 4.1 mmol/L (ref 3.5–5.1)
Sodium: 137 mmol/L (ref 135–145)

## 2022-07-19 LAB — CBC WITH DIFFERENTIAL/PLATELET
Abs Immature Granulocytes: 0.01 10*3/uL (ref 0.00–0.07)
Basophils Absolute: 0 10*3/uL (ref 0.0–0.1)
Basophils Relative: 1 %
Eosinophils Absolute: 0.1 10*3/uL (ref 0.0–0.5)
Eosinophils Relative: 2 %
HCT: 44.7 % (ref 39.0–52.0)
Hemoglobin: 16.1 g/dL (ref 13.0–17.0)
Immature Granulocytes: 0 %
Lymphocytes Relative: 43 %
Lymphs Abs: 1.5 10*3/uL (ref 0.7–4.0)
MCH: 33.3 pg (ref 26.0–34.0)
MCHC: 36 g/dL (ref 30.0–36.0)
MCV: 92.5 fL (ref 80.0–100.0)
Monocytes Absolute: 0.4 10*3/uL (ref 0.1–1.0)
Monocytes Relative: 10 %
Neutro Abs: 1.6 10*3/uL — ABNORMAL LOW (ref 1.7–7.7)
Neutrophils Relative %: 44 %
Platelets: 196 10*3/uL (ref 150–400)
RBC: 4.83 MIL/uL (ref 4.22–5.81)
RDW: 12.9 % (ref 11.5–15.5)
WBC: 3.6 10*3/uL — ABNORMAL LOW (ref 4.0–10.5)
nRBC: 0 % (ref 0.0–0.2)

## 2022-07-19 MED ORDER — NAPROXEN 500 MG PO TABS: 500.0000 mg | ORAL_TABLET | Freq: Two times a day (BID) | ORAL | 0 refills | Status: DC | PRN

## 2022-07-19 MED ORDER — IBUPROFEN 800 MG PO TABS
800.0000 mg | ORAL_TABLET | Freq: Once | ORAL | Status: AC
  Administered 2022-07-19: 800 mg via ORAL
  Filled 2022-07-19: qty 1

## 2022-07-19 MED ORDER — AMLODIPINE BESYLATE 5 MG PO TABS: 5.0000 mg | ORAL_TABLET | Freq: Every day | ORAL | 0 refills | Status: DC
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
## 2022-07-19 NOTE — ED Notes (Signed)
Alert, NAD, calm, interactive, steady gait to b/r

## 2022-07-19 NOTE — Discharge Instructions (Signed)
Suspect her elbow and heel pain are musculoskeletal.  Take the anti-inflammatories as prescribed and follow-up with your primary doctor.  Blood pressure today significantly elevated which can cause damage to your heart, lungs, brain, eyes and kidneys.  You should check your blood pressure on a regular basis and follow-up with your primary doctor.  Follow-up with a cardiologist as well regarding her abnormal EKG as you likely need to have an echocardiogram.  Return to the ED with chest pain, shortness of breath, any other concerns.

## 2022-07-19 NOTE — ED Notes (Addendum)
EDP at [facility] discussing results. Pt alert, NAD, calm, interactive. C/o L elbow pain and R heel pain. CMS, skin all intact. ROM painful and limited in L elbow. Denies CP or sob.

## 2022-07-19 NOTE — ED Notes (Signed)
Reviewed AVS/discharge instruction with patient. Time allotted for and all questions answered. Patient is agreeable for d/c and escorted to ed exit by staff.  
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
## 2022-07-31 ENCOUNTER — Ambulatory Visit
Admission: EM | Admit: 2022-07-31 | Discharge: 2022-07-31 | Disposition: A | Discharge status: Home / Self Care | Payer: Medicaid Other | Attending: Internal Medicine | Admitting: Internal Medicine

## 2022-07-31 DIAGNOSIS — K0889 Other specified disorders of teeth and supporting structures: Secondary | ICD-10-CM | POA: Diagnosis not present

## 2022-07-31 DIAGNOSIS — T148XXA Other injury of unspecified body region, initial encounter: Secondary | ICD-10-CM | POA: Diagnosis not present

## 2022-07-31 DIAGNOSIS — M546 Pain in thoracic spine: Secondary | ICD-10-CM | POA: Diagnosis not present

## 2022-07-31 MED ORDER — KETOROLAC TROMETHAMINE 30 MG/ML IJ SOLN
30.0000 mg | Freq: Once | INTRAMUSCULAR | Status: AC
  Administered 2022-07-31: 30 mg via INTRAMUSCULAR

## 2022-07-31 MED ORDER — AMOXICILLIN-POT CLAVULANATE 875-125 MG PO TABS: 1.0000 | ORAL_TABLET | Freq: Two times a day (BID) | ORAL | 0 refills | Status: DC

## 2022-07-31 NOTE — Discharge Instructions (Signed)
Follow-up with dentist.  Antibiotic has been prescribed for dental pain/infection.  Muscle relaxer that you have been previously prescribed may be helpful with back pain.  Please remember that it can make you drowsy.  Do not drive or drink alcohol with it.  Shot has been administered today to help with dental pain and back pain.  Do not take any additional ibuprofen, Advil, Aleve for at least 24 hours following injection.  It is recommended that you start taking your blood pressure medication given that your blood pressure is high today.  Please monitor blood pressure at home.  If top number is above 140, or bottom number is above 100, this is considered high.  Follow-up with family doctor for further evaluation and management of blood pressure.

## 2022-07-31 NOTE — ED Triage Notes (Signed)
Pt presents to [facility] with co of left sided dental pain on left side for one week has been using sensitive tooth paste and gargling salt water.   Pt reports left mid back pain for one day

## 2022-07-31 NOTE — ED Provider Notes (Signed)
[facility]    CSN: [id number] Arrival date & time: 07/31/22  1300      History   Chief Complaint Chief Complaint  Patient presents with   Dental Pain   Back Pain    HPI Scott Vance is a 57 y.o. male.   Patient presents with 2 different chief complaints.  Patient reports left lower sided dental pain that has been present for 1 week.  Denies trauma to the area.  Denies fever.  Denies drainage.  Patient has taken ibuprofen for pain well as using toothpaste and gargling salt water.  Patient reports that pain was originally present only when drinking water but is now more constant.  Patient also reporting left thoracic back pain that started yesterday.  Patient denies any obvious injury but reports that he has been doing "stretches".  Denies numbness or tingling.  Has taken ibuprofen for pain.  Patient's blood pressure is also elevated.  He reports that he has amlodipine that he still does not take daily.  Does not check blood pressure at home.  Patient does not report chest pain, shortness of breath, headache, dizziness, nausea, vomiting, blurred vision.   Dental Pain Back Pain   Past Medical History:  Diagnosis Date   Asthma     There are no problems to display for this patient.   History reviewed. No pertinent surgical history.     Home Medications    Prior to Admission medications   Medication Sig Start Date End Date Taking? Authorizing Provider  amoxicillin-clavulanate (AUGMENTIN) 875-125 MG tablet Take 1 tablet by mouth every 12 (twelve) hours. 07/31/22  Yes Terell Kincy, Hildred Alamin E, FNP  amLODipine (NORVASC) 5 MG tablet Take 1 tablet (5 mg total) by mouth daily. 07/19/22   Rancour, Annie Main, MD  Ascorbic Acid (VITAMIN C) 1000 MG tablet Take 1,000 mg by mouth daily.    [provider]  aspirin EC 81 MG tablet Take 81 mg by mouth daily as needed (blood circulation). Swallow whole.    [provider]  azithromycin (ZITHROMAX Z-PAK) 250 MG tablet  Take 2 initially, then 1 tablet daily until completed. 05/04/22   Nyoka Lint, PA-C  cyclobenzaprine (FLEXERIL) 10 MG tablet Take 1 tablet (10 mg total) by mouth 2 (two) times daily as needed for muscle spasms. 07/02/22   Azucena Cecil, PA-C  dextromethorphan-guaiFENesin (MUCINEX DM) 30-600 MG 12hr tablet Take 1 tablet by mouth 2 (two) times daily. 05/04/22   Nyoka Lint, PA-C  diphenhydrAMINE HCl (ALLERGY MED PO) Take 1 tablet by mouth daily as needed (allergies).    [provider]  fluticasone (FLONASE) 50 MCG/ACT nasal spray Place 1 spray into both nostrils in the morning and at bedtime. Patient not taking: Reported on 09/19/2021 06/12/21   Geryl Councilman L, PA  fluticasone (FLONASE) 50 MCG/ACT nasal spray Place 2 sprays into both nostrils daily. 05/04/22   Nyoka Lint, PA-C  guaiFENesin (MUCUS RELIEF PO) Take 2 tablets by mouth 2 (two) times daily as needed (congestion).    [provider]  HYDROcodone-acetaminophen (NORCO/VICODIN) 5-325 MG tablet Take 1-2 tablets by mouth every 6 (six) hours as needed for moderate pain or severe pain. 03/13/22   Charlesetta Shanks, MD  naproxen (NAPROSYN) 500 MG tablet Take 1 tablet (500 mg total) by mouth 2 (two) times daily as needed. 07/19/22   Rancour, Annie Main, MD  naproxen sodium (ALEVE) 220 MG tablet Take 220 mg by mouth daily as needed (pain/headache).    [provider]  omeprazole (  PRILOSEC) 20 MG capsule Take 1 capsule (20 mg total) by mouth daily. 03/13/22   Charlesetta Shanks, MD  pantoprazole (PROTONIX) 20 MG tablet Take 1 tablet (20 mg total) by mouth daily. Patient not taking: Reported on 09/19/2021 06/12/21 07/12/21  Geryl Councilman L, PA  dicyclomine (BENTYL) 20 MG tablet Take 1 tablet (20 mg total) by mouth 2 (two) times daily. Patient not taking: Reported on 12/14/2018 10/15/17 03/20/19  Margarita Mail, PA-C  famotidine (PEPCID) 20 MG tablet Take 1 tablet (20 mg total) by mouth 2 (two) times daily. Patient not taking:  Reported on 12/14/2018 10/15/17 03/20/19  Margarita Mail, PA-C  simethicone (MYLICON) 80 MG chewable tablet Chew 80 mg by mouth every 6 (six) hours as needed for flatulence.  03/20/19  [provider]  sucralfate (CARAFATE) 1 g tablet Take 1 tablet (1 g total) by mouth 3 (three) times daily. Patient not taking: Reported on 12/14/2018 10/15/17 03/20/19  Margarita Mail, PA-C    Family History History reviewed. No pertinent family history.  Social History Social History   Tobacco Use   Smoking status: Some Days    Types: Cigarettes   Smokeless tobacco: Never  Vaping Use   Vaping Use: Never used  Substance Use Topics   Alcohol use: No   Drug use: No     Allergies   Patient has no known allergies.   Review of Systems Review of Systems Per HPI  Physical Exam Triage Vital Signs ED Triage Vitals  Enc Vitals Group     BP 07/31/22 1514 (!) 156/108     Pulse Rate 07/31/22 1514 73     Resp 07/31/22 1514 19     Temp 07/31/22 1514 98.3 F (36.8 C)     Temp src --      SpO2 07/31/22 1514 98 %     Weight --      Height --      Head Circumference --      Peak Flow --      Pain Score 07/31/22 1513 8     Pain Loc --      Pain Edu? --      Excl. in [place]? --    No data found.  Updated Vital Signs BP (!) 156/108   Pulse 73   Temp 98.3 F (36.8 C)   Resp 19   SpO2 98%   Visual Acuity Right Eye Distance:   Left Eye Distance:   Bilateral Distance:    Right Eye Near:   Left Eye Near:    Bilateral Near:     Physical Exam Constitutional:      General: He is not in acute distress.    Appearance: Normal appearance. He is not toxic-appearing or diaphoretic.  HENT:     Head: Normocephalic and atraumatic.     Mouth/Throat:     Dentition: Dental tenderness present.      Comments: Patient reporting pain in the left lower dentition.  There is no obvious gingival erythema or swelling.  No obvious abscess. Eyes:     Extraocular Movements: Extraocular movements intact.      Conjunctiva/sclera: Conjunctivae normal.  Cardiovascular:     Rate and Rhythm: Normal rate and regular rhythm.     Pulses: Normal pulses.     Heart sounds: Normal heart sounds.  Pulmonary:     Effort: Pulmonary effort is normal. No respiratory distress.     Breath sounds: Normal breath sounds.  Musculoskeletal:       Back:  Comments: Tenderness to palpation to left thoracic back.  No direct spinal tenderness, crepitus, step-off noted.  No swelling or discoloration noted.  Patient has full range of motion of upper extremities.  Grip strength is 5/5.  Neurological:     General: No focal deficit present.     Mental Status: He is alert and oriented to person, place, and time. Mental status is at baseline.     Cranial Nerves: Cranial nerves 2-12 are intact.     Sensory: Sensation is intact.     Motor: Motor function is intact.     Coordination: Coordination is intact.     Gait: Gait is intact.  Psychiatric:        Mood and Affect: Mood normal.        Behavior: Behavior normal.        Thought Content: Thought content normal.        Judgment: Judgment normal.      UC Treatments / Results  Labs (all labs ordered are listed, but only abnormal results are displayed) Labs Reviewed - No data to display  EKG   Radiology No results found.  Procedures Procedures (including critical care time)  Medications Ordered in UC Medications  ketorolac (TORADOL) 30 MG/ML injection 30 mg (30 mg Intramuscular Given 07/31/22 1543)    Initial Impression / Assessment and Plan / UC Course  I have reviewed the triage vital signs and the nursing notes.  Pertinent labs & imaging results that were available during my care of the patient were reviewed by me and considered in my medical decision making (see chart for details).     1.  Dental pain There is no obvious dental infection that is notable on physical exam but I am concerned about this given persistent and constant dental pain.   Therefore, will treat with Augmentin antibiotic.  Advised patient to follow-up with dentist for further evaluation and management and patient was provided with contact information for dentist.  IM Toradol administered for dental pain as well.  Advised patient to not take any additional NSAIDs for at least 24 hours following injection.  2.  Back pain Suspect thoracic muscle strain.  Given no direct injury and no direct spinal tenderness, will defer imaging.  Patient has muscle relaxers that were previously prescribed at different visit.  Encouraged him to use this as can be beneficial.  Reminded him that they can make him drowsy and do not drive or drink alcohol while taking it.  IM Toradol administered should be helpful with this as well.  3.  Essential hypertension  Reports that he has amlodipine at home and encouraged him to monitor blood pressure and start taking this medication.  Patient to follow-up with family doctor for any further workup.  Patient is asymptomatic regarding blood pressure and neuroexam is normal so do not think that emergent evaluation is necessary.  Discussed return precautions.  Patient verbalized understanding and was agreeable with plan. Final Clinical Impressions(s) / UC Diagnoses   Final diagnoses:  Pain, dental  Muscle strain  Acute left-sided thoracic back pain     Discharge Instructions      Follow-up with dentist.  Antibiotic has been prescribed for dental pain/infection.  Muscle relaxer that you have been previously prescribed may be helpful with back pain.  Please remember that it can make you drowsy.  Do not drive or drink alcohol with it.  Shot has been administered today to help with dental pain and back pain.  Do not  take any additional ibuprofen, Advil, Aleve for at least 24 hours following injection.  It is recommended that you start taking your blood pressure medication given that your blood pressure is high today.  Please monitor blood pressure at  home.  If top number is above 140, or bottom number is above 100, this is considered high.  Follow-up with family doctor for further evaluation and management of blood pressure.    ED Prescriptions     Medication Sig Dispense Auth. Provider   amoxicillin-clavulanate (AUGMENTIN) 875-125 MG tablet Take 1 tablet by mouth every 12 (twelve) hours. 14 tablet [place], Michele Rockers, [place]      PDMP not reviewed this encounter.   Teodora Medici,  07/31/22 [phone]

## 2022-08-12 IMAGING — DX DG FOOT COMPLETE 3+V*R*
3 series · 3 of 3 positions shown · non-contrast
Comparison: None.

CLINICAL DATA: Burning and tingling in foot

EXAM:
RIGHT FOOT COMPLETE - 3+ VIEW

[foot ap]
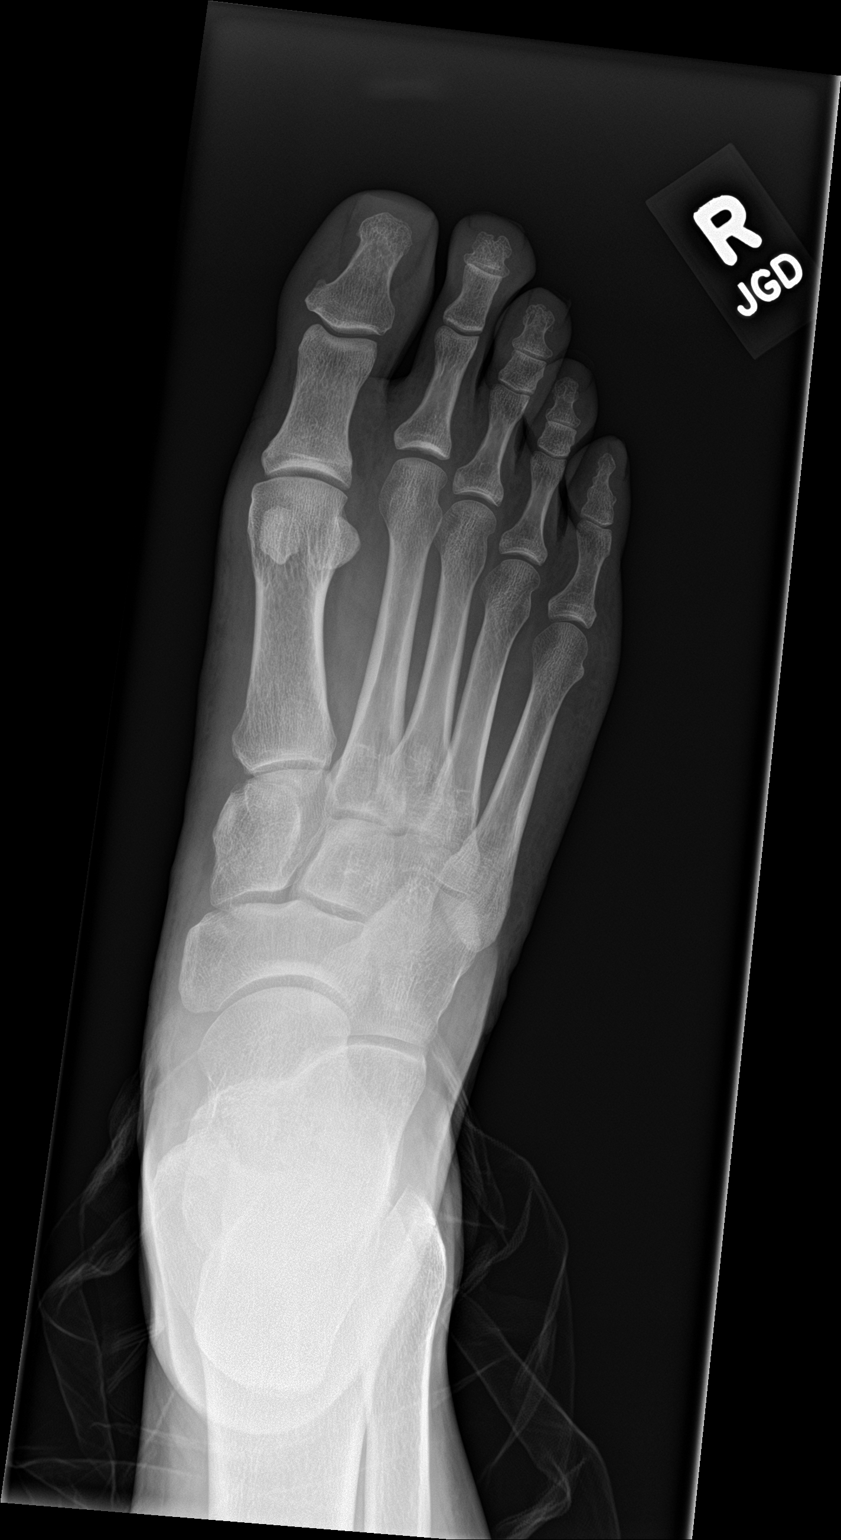

[foot obl]
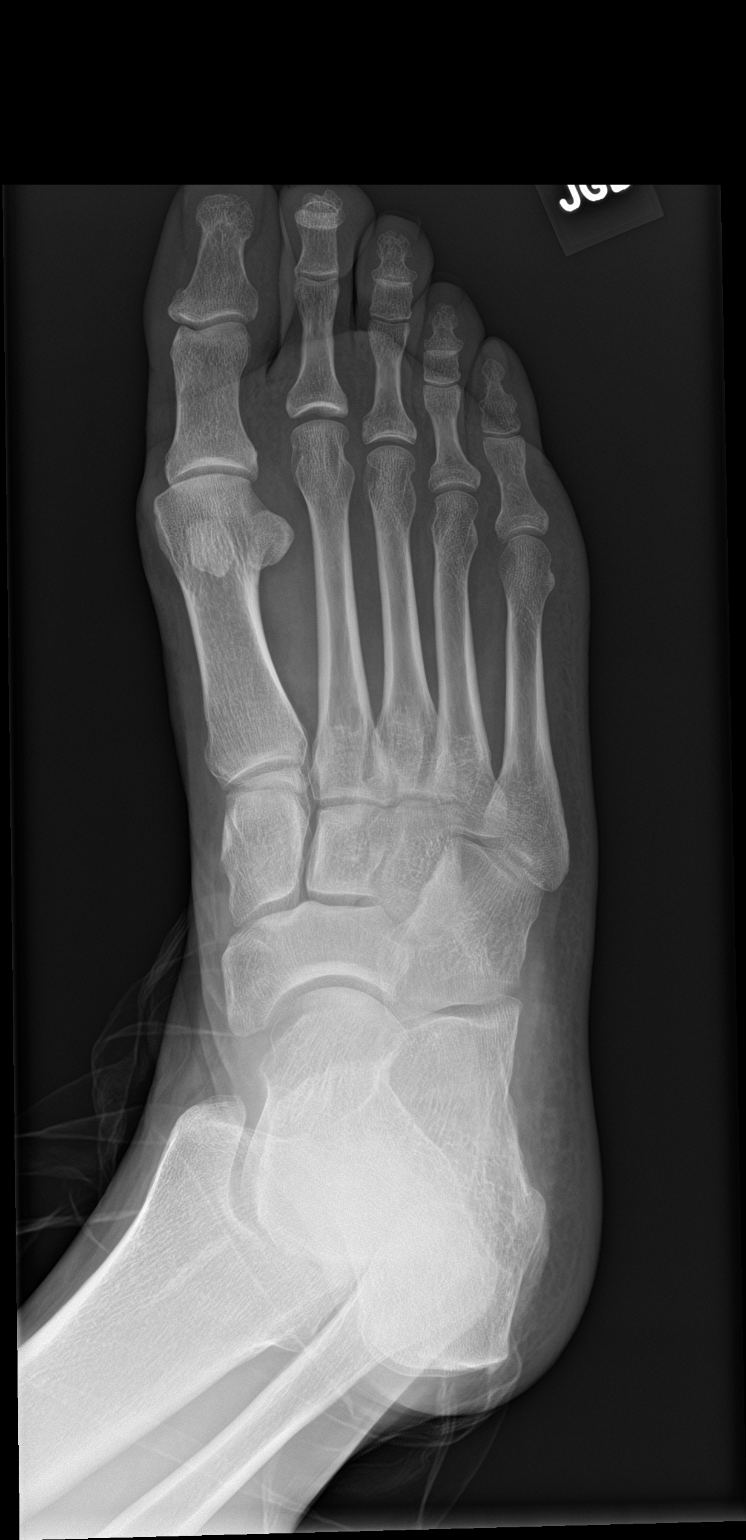

[foot lat]
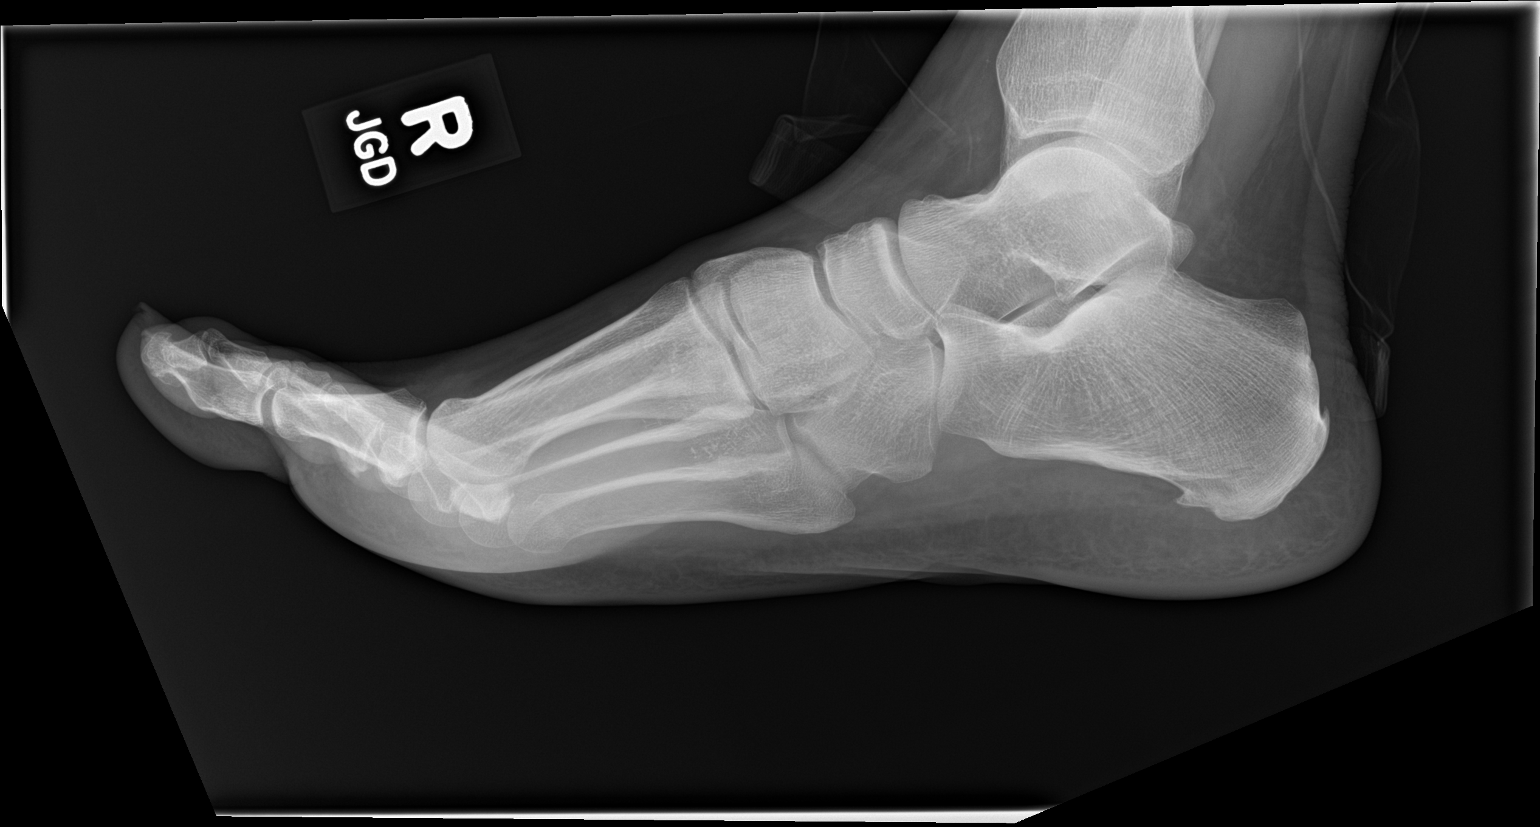

[3 of 3 positions shown; findings below may reference images not displayed]

FINDINGS: There is no evidence of fracture or dislocation. There is no
evidence of arthropathy or other focal bone abnormality. Plantar and
Achilles calcaneal enthesophytes. Soft tissues are unremarkable.
IMPRESSION: Negative.

## 2022-09-22 ENCOUNTER — Encounter: Payer: Self-pay | Admitting: Primary Care

## 2022-09-22 ENCOUNTER — Ambulatory Visit (INDEPENDENT_AMBULATORY_CARE_PROVIDER_SITE_OTHER): Payer: Medicaid Other | Admitting: Primary Care

## 2022-09-22 VITALS — BP 148/98 | HR 82 | Ht 68.0 in | Wt 238.0 lb

## 2022-09-22 DIAGNOSIS — I10 Essential (primary) hypertension: Secondary | ICD-10-CM

## 2022-09-22 DIAGNOSIS — G4730 Sleep apnea, unspecified: Secondary | ICD-10-CM | POA: Insufficient documentation

## 2022-09-22 DIAGNOSIS — J45909 Unspecified asthma, uncomplicated: Secondary | ICD-10-CM

## 2022-09-22 DIAGNOSIS — I158 Other secondary hypertension: Secondary | ICD-10-CM

## 2022-09-22 DIAGNOSIS — D72819 Decreased white blood cell count, unspecified: Secondary | ICD-10-CM | POA: Insufficient documentation

## 2022-09-22 DIAGNOSIS — R0683 Snoring: Secondary | ICD-10-CM

## 2022-09-22 DIAGNOSIS — E782 Mixed hyperlipidemia: Secondary | ICD-10-CM | POA: Insufficient documentation

## 2022-09-22 HISTORY — DX: Essential (primary) hypertension: I10

## 2022-09-22 HISTORY — DX: Unspecified asthma, uncomplicated: J45.909

## 2022-09-22 NOTE — Progress Notes (Signed)
@Patient  ID: Scott Vance, male    DOB: [birth year], 57 y.o.   MRN: [id number]  Chief Complaint  Patient presents with   Consult    No sleep study  Epworth 2     Referring provider: Trey Sailors, PA  HPI: 57 year old male, current smoker. PMH significant for HTN, asthma, leukopenia, mixed hyperlipidemia.   09/22/2022 Patient presents today for sleep consult. He has symptoms of snoring and sleep disruption. He wakes up in the middle of the night, unsure what is causing him to wake up but thinks it may be his snoring. He does not feel he gets enough sleep. Bedtime can vary between midnight-2am. He gets up most mornings at 7am. He feels fairly rested when he wakes up in the morning. He does not nap during the day.  Epworth score 2. No prior sleep studies. Denies symptoms of narcolepsy, cataplexy or sleepwalking.  Blood pressure is elevated today. He has not started his blood pressure medication. He does not want to take antihypertensive medication d.t potential for drowsiness. He would like to work on getting his blood pressure down on his own.   Sleep questionnaire Symptoms-  snoring, waking up in the middle of the night  Prior sleep study- None  Bedtime- 12am - 2am  Time to fall asleep- 30 mins Nocturnal awakenings- 1-2 times  Out of bed/start of day- 7am Weight changes- stable  Do you operate heavy machinery- no Do you currently wear CPAP- no Do you current wear oxygen- no Epworth- 2  No Known Allergies  Immunization History  Administered Date(s) Administered   Tdap 10/13/2020    Past Medical History:  Diagnosis Date   Acid reflux    Asthma    Asthma 09/22/2022   Hypertension 09/22/2022    Tobacco History: Social History   Tobacco Use  Smoking Status Some Days   Types: Cigarettes  Smokeless Tobacco Never  Tobacco Comments   1/2 pack a day    Ready to quit: Not Answered Counseling given: Not Answered Tobacco comments: 1/2 pack a day    Outpatient  Medications Prior to Visit  Medication Sig Dispense Refill   Misc Natural Products (YUMVS BEET ROOT-TART CHERRY PO) Take by mouth.     amLODipine (NORVASC) 5 MG tablet Take 1 tablet (5 mg total) by mouth daily. (Patient not taking: Reported on 09/22/2022) 30 tablet 0   amoxicillin-clavulanate (AUGMENTIN) 875-125 MG tablet Take 1 tablet by mouth every 12 (twelve) hours. (Patient not taking: Reported on 09/22/2022) 14 tablet 0   Ascorbic Acid (VITAMIN C) 1000 MG tablet Take 1,000 mg by mouth daily. (Patient not taking: Reported on 09/22/2022)     aspirin EC 81 MG tablet Take 81 mg by mouth daily as needed (blood circulation). Swallow whole. (Patient not taking: Reported on 09/22/2022)     azithromycin (ZITHROMAX Z-PAK) 250 MG tablet Take 2 initially, then 1 tablet daily until completed. (Patient not taking: Reported on 09/22/2022) 6 each 0   cyclobenzaprine (FLEXERIL) 10 MG tablet Take 1 tablet (10 mg total) by mouth 2 (two) times daily as needed for muscle spasms. (Patient not taking: Reported on 09/22/2022) 10 tablet 0   dextromethorphan-guaiFENesin (MUCINEX DM) 30-600 MG 12hr tablet Take 1 tablet by mouth 2 (two) times daily. (Patient not taking: Reported on 09/22/2022) 20 tablet 0   diphenhydrAMINE HCl (ALLERGY MED PO) Take 1 tablet by mouth daily as needed (allergies). (Patient not taking: Reported on 09/22/2022)     fluticasone (FLONASE) 50  MCG/ACT nasal spray Place 1 spray into both nostrils in the morning and at bedtime. (Patient not taking: Reported on 09/19/2021) 16 mL 0   fluticasone (FLONASE) 50 MCG/ACT nasal spray Place 2 sprays into both nostrils daily. (Patient not taking: Reported on 09/22/2022) 11.1 g 2   guaiFENesin (MUCUS RELIEF PO) Take 2 tablets by mouth 2 (two) times daily as needed (congestion). (Patient not taking: Reported on 09/22/2022)     HYDROcodone-acetaminophen (NORCO/VICODIN) 5-325 MG tablet Take 1-2 tablets by mouth every 6 (six) hours as needed for moderate pain or severe pain. (Patient not  taking: Reported on 09/22/2022) 20 tablet 0   naproxen (NAPROSYN) 500 MG tablet Take 1 tablet (500 mg total) by mouth 2 (two) times daily as needed. 30 tablet 0   naproxen sodium (ALEVE) 220 MG tablet Take 220 mg by mouth daily as needed (pain/headache). (Patient not taking: Reported on 09/22/2022)     omeprazole (PRILOSEC) 20 MG capsule Take 1 capsule (20 mg total) by mouth daily. (Patient not taking: Reported on 09/22/2022) 30 capsule 0   pantoprazole (PROTONIX) 20 MG tablet Take 1 tablet (20 mg total) by mouth daily. (Patient not taking: Reported on 09/19/2021) 30 tablet 0   No facility-administered medications prior to visit.   Review of Systems  Review of Systems  Constitutional:  Negative for fatigue.  HENT: Negative.    Respiratory: Negative.     Physical Exam  BP (!) 148/98 (BP Location: Right Arm, Patient Position: Sitting)   Pulse 82   Ht 5\' 8"  (1.727 m)   Wt 238 lb (108 kg)   SpO2 96%   BMI 36.19 kg/m  Physical Exam Constitutional:      General: He is not in acute distress.    Appearance: Normal appearance. He is not ill-appearing.  HENT:     Head: Normocephalic and atraumatic.     Mouth/Throat:     Mouth: Mucous membranes are moist.     Pharynx: Oropharynx is clear.  Cardiovascular:     Rate and Rhythm: Normal rate and regular rhythm.  Pulmonary:     Effort: Pulmonary effort is normal.     Breath sounds: Normal breath sounds.  Skin:    General: Skin is warm and dry.  Neurological:     General: No focal deficit present.     Mental Status: He is alert and oriented to person, place, and time. Mental status is at baseline.  Psychiatric:        Mood and Affect: Mood normal.        Behavior: Behavior normal.        Thought Content: Thought content normal.        Judgment: Judgment normal.      Lab Results:  CBC    Component Value Date/Time   WBC 3.6 (L) 07/19/2022 1140   RBC 4.83 07/19/2022 1140   HGB 16.1 07/19/2022 1140   HCT 44.7 07/19/2022 1140   PLT 196  07/19/2022 1140   MCV 92.5 07/19/2022 1140   MCH 33.3 07/19/2022 1140   MCHC 36.0 07/19/2022 1140   RDW 12.9 07/19/2022 1140   LYMPHSABS 1.5 07/19/2022 1140   MONOABS 0.4 07/19/2022 1140   EOSABS 0.1 07/19/2022 1140   BASOSABS 0.0 07/19/2022 1140    BMET    Component Value Date/Time   NA 137 07/19/2022 1205   K 4.1 07/19/2022 1205   CL 105 07/19/2022 1205   CO2 27 07/19/2022 1205   GLUCOSE 101 (H) 07/19/2022 1205  BUN 9 07/19/2022 1205   CREATININE 0.92 07/19/2022 1205   CALCIUM 9.4 07/19/2022 1205   GFRNONAA >60 07/19/2022 1205   GFRAA >60 12/14/2018 0950    BNP No results found for: "BNP"  ProBNP No results found for: "PROBNP"  Imaging: No results found.   Assessment & Plan:   Hypertension - Blood pressure elevated today, he is non-compliant with medication. We had a long conversation about risks of untreated hypertension. Recommend he start Norvasc 5mg  as directed. He does not want to take antihypertensive medication and understands risk. He is going to work on weight loss and stress reduction. Encourage he quit smoking. Advised he follow-up with primary care.   Loud snoring - Patient has symptoms loud snoring and sleep disruption. He wakes up at the same time every night, unsure what is waking him up. No significant associated daytime sleepiness.  Epworth score is 2. BMI 36. Concern patient could have underlying obstructive sleep apnea, patient needs home sleep study evaluate for OSA.  We reviewed risks of untreated sleep apnea including cardiac arrhythmias, pulmonary hypertension, diabetes and stroke.  We also discussed treatment options including weight loss, oral appliance, CPAP therapy or referral to ENT for possible surgical options.  Advised patient focus on side sleeping position and work on weight loss efforts.  Cautioned against driving if experiencing excessive daytime sleepiness or fatigue.  Follow-up 1 to 2 weeks after sleep study to review results and  treatment options if needed.  Martyn Ehrich, NP 09/22/2022

## 2022-09-22 NOTE — Patient Instructions (Addendum)
Sleep apnea is defined as period of 10 seconds or longer when you stop breathing at night. This can happen multiple times a night. Dx sleep apnea is when this occurs more than 5 times an hour.    Mild OSA 5-15 apneic events an hour Moderate OSA 15-30 apneic events an hour Severe OSA > 30 apneic events an hour   Untreated sleep apnea puts you at higher risk for cardiac arrhythmias, pulmonary HTN, stroke and diabetes  Treatment options include weight loss, side sleeping position, oral appliance, CPAP therapy or referral to ENT for possible surgical options    Recommendations: - You may need to prioritize getting to bed earlier  - Focus on side sleeping position  - Do not drive if experiencing excessive daytime sleepiness of fatigue  - Advised you start blood pressure medication, your blood pressure was 150-160/100 today. Normal BP is 120/80. You are at a higher risk for stroke with elevated blood pressure reading  - Quit smoking, Work on weight loss efforts if able   Orders: - Home sleep study re: loud snoring    Follow-up: Please call to schedule follow-up 1-2 weeks after completing home sleep study to review results and treatment if needed (can be virtual)  Hypertension, Adult Hypertension is another name for high blood pressure. High blood pressure forces your heart to work harder to pump blood. This can cause problems over time. There are two numbers in a blood pressure reading. There is a top number (systolic) over a bottom number (diastolic). It is best to have a blood pressure that is below 120/80. What are the causes? The cause of this condition is not known. Some other conditions can lead to high blood pressure. What increases the risk? Some lifestyle factors can make you more likely to develop high blood pressure: Smoking. Not getting enough exercise or physical activity. Being overweight. Having too much fat, sugar, calories, or salt (sodium) in your diet. Drinking too  much alcohol. Other risk factors include: Having any of these conditions: Heart disease. Diabetes. High cholesterol. Kidney disease. Obstructive sleep apnea. Having a family history of high blood pressure and high cholesterol. Age. The risk increases with age. Stress. What are the signs or symptoms? High blood pressure may not cause symptoms. Very high blood pressure (hypertensive crisis) may cause: Headache. Fast or uneven heartbeats (palpitations). Shortness of breath. Nosebleed. Vomiting or feeling like you may vomit (nauseous). Changes in how you see. Very bad chest pain. Feeling dizzy. Seizures. How is this treated? This condition is treated by making healthy lifestyle changes, such as: Eating healthy foods. Exercising more. Drinking less alcohol. Your doctor may prescribe medicine if lifestyle changes do not help enough and if: Your top number is above 130. Your bottom number is above 80. Your personal target blood pressure may vary. Follow these instructions at home: Eating and drinking  If told, follow the DASH eating plan. To follow this plan: Fill one half of your plate at each meal with fruits and vegetables. Fill one fourth of your plate at each meal with whole grains. Whole grains include whole-wheat pasta, brown rice, and whole-grain bread. Eat or drink low-fat dairy products, such as skim milk or low-fat yogurt. Fill one fourth of your plate at each meal with low-fat (lean) proteins. Low-fat proteins include fish, chicken without skin, eggs, beans, and tofu. Avoid fatty meat, cured and processed meat, or chicken with skin. Avoid pre-made or processed food. Limit the amount of salt in your diet to less  than 1,500 mg each day. Do not drink alcohol if: Your doctor tells you not to drink. You are pregnant, may be pregnant, or are planning to become pregnant. If you drink alcohol: Limit how much you have to: 0-1 drink a day for women. 0-2 drinks a day for  men. Know how much alcohol is in your drink. In the [place] one drink equals one 12 oz bottle of beer (355 mL), one 5 oz glass of wine (148 mL), or one 1 oz glass of hard liquor (44 mL). Lifestyle  Work with your doctor to stay at a healthy weight or to lose weight. Ask your doctor what the best weight is for you. Get at least 30 minutes of exercise that causes your heart to beat faster (aerobic exercise) most days of the week. This may include walking, swimming, or biking. Get at least 30 minutes of exercise that strengthens your muscles (resistance exercise) at least 3 days a week. This may include lifting weights or doing Pilates. Do not smoke or use any products that contain nicotine or tobacco. If you need help quitting, ask your doctor. Check your blood pressure at home as told by your doctor. Keep all follow-up visits. Medicines Take over-the-counter and prescription medicines only as told by your doctor. Follow directions carefully. Do not skip doses of blood pressure medicine. The medicine does not work as well if you skip doses. Skipping doses also puts you at risk for problems. Ask your doctor about side effects or reactions to medicines that you should watch for. Contact a doctor if: You think you are having a reaction to the medicine you are taking. You have headaches that keep coming back. You feel dizzy. You have swelling in your ankles. You have trouble with your vision. Get help right away if: You get a very bad headache. You start to feel mixed up (confused). You feel weak or numb. You feel faint. You have very bad pain in your: Chest. Belly (abdomen). You vomit more than once. You have trouble breathing. These symptoms may be an emergency. Get help right away. Call 911. Do not wait to see if the symptoms will go away. Do not drive yourself to the hospital. Summary Hypertension is another name for high blood pressure. High blood pressure forces your heart to work  harder to pump blood. For most people, a normal blood pressure is less than 120/80. Making healthy choices can help lower blood pressure. If your blood pressure does not get lower with healthy choices, you may need to take medicine. This information is not intended to replace advice given to you by your health care provider. Make sure you discuss any questions you have with your health care provider. Document Revised: 03/26/2021 Document Reviewed: 03/26/2021 Elsevier Patient Education  [place].

## 2022-09-22 NOTE — Assessment & Plan Note (Signed)
-   Patient has symptoms loud snoring and sleep disruption. He wakes up at the same time every night, unsure what is waking him up. No significant associated daytime sleepiness.  Epworth score is 2. BMI 36. Concern patient could have underlying obstructive sleep apnea, patient needs home sleep study evaluate for OSA.  We reviewed risks of untreated sleep apnea including cardiac arrhythmias, pulmonary hypertension, diabetes and stroke.  We also discussed treatment options including weight loss, oral appliance, CPAP therapy or referral to ENT for possible surgical options.  Advised patient focus on side sleeping position and work on weight loss efforts.  Cautioned against driving if experiencing excessive daytime sleepiness or fatigue.  Follow-up 1 to 2 weeks after sleep study to review results and treatment options if needed.

## 2022-09-22 NOTE — Assessment & Plan Note (Addendum)
-   Blood pressure elevated today, he is non-compliant with medication. We had a long conversation about risks of untreated hypertension. Recommend he start Norvasc 5mg  as directed. He does not want to take antihypertensive medication and understands risk. He is going to work on weight loss and stress reduction. Encourage he quit smoking. Advised he follow-up with primary care.

## 2022-09-26 NOTE — Progress Notes (Signed)
Reviewed and agree with assessment/plan.   Magdalyn Arenivas, MD [facility] 09/26/2022, 9:55 AM Pager:  [phone]  

## 2022-10-04 ENCOUNTER — Telehealth: Payer: Self-pay | Admitting: Pulmonary Disease

## 2022-10-04 NOTE — Telephone Encounter (Signed)
PT calling needing to resched his sleep study. Asking for [facility]. TY.

## 2022-10-04 NOTE — Telephone Encounter (Signed)
I spoke to pt & rescheduled his sleep study.  Nothing further needed.

## 2022-10-27 ENCOUNTER — Ambulatory Visit (INDEPENDENT_AMBULATORY_CARE_PROVIDER_SITE_OTHER): Payer: Medicaid Other

## 2022-10-27 DIAGNOSIS — G4733 Obstructive sleep apnea (adult) (pediatric): Secondary | ICD-10-CM

## 2022-10-27 DIAGNOSIS — R0683 Snoring: Secondary | ICD-10-CM

## 2022-10-28 ENCOUNTER — Telehealth: Payer: Self-pay | Admitting: Primary Care

## 2022-10-28 NOTE — Telephone Encounter (Signed)
Pt came in to return HST Box and wants to know if he completed it right, pls advise.

## 2022-10-28 NOTE — Telephone Encounter (Signed)
I called patient back and let him know

## 2022-10-31 IMAGING — DX DG CHEST 2V
2 series · 2 of 2 positions shown · non-contrast
Comparison: 12/14/2018

CLINICAL DATA: Chest pain

EXAM:
CHEST - 2 VIEW

[chest pa]
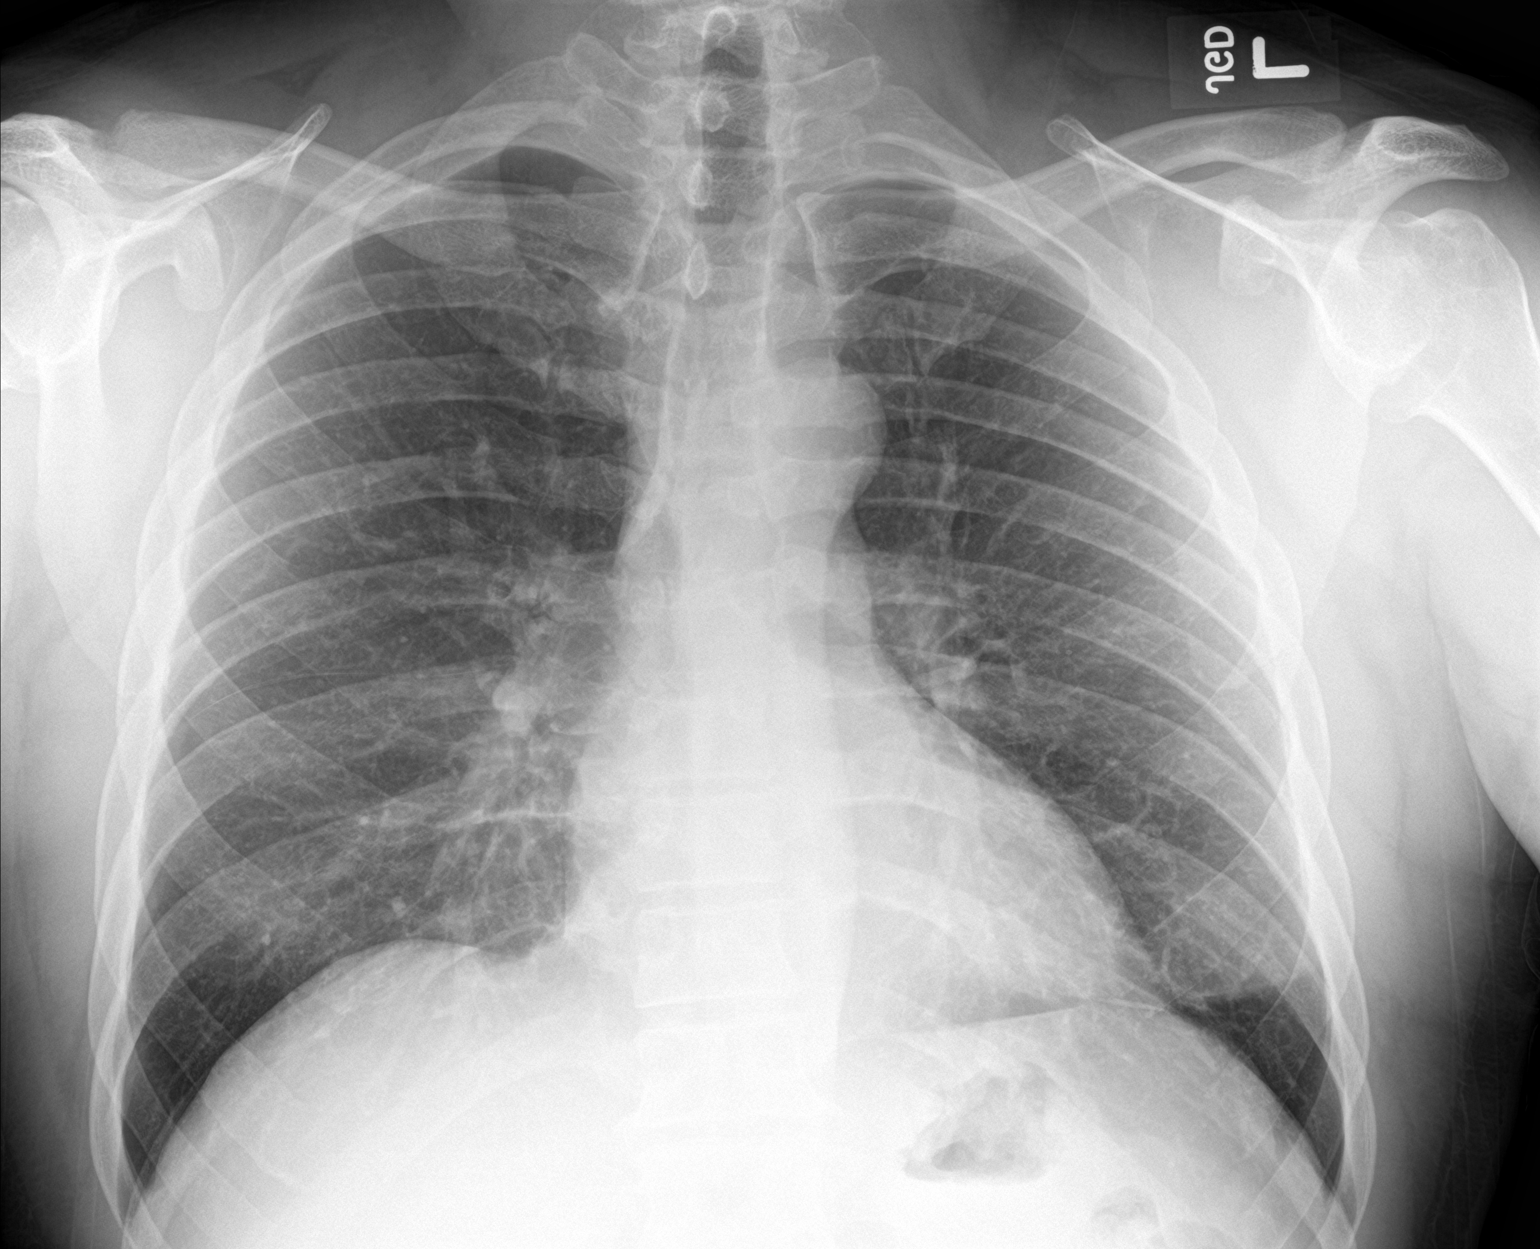

[chest lat]
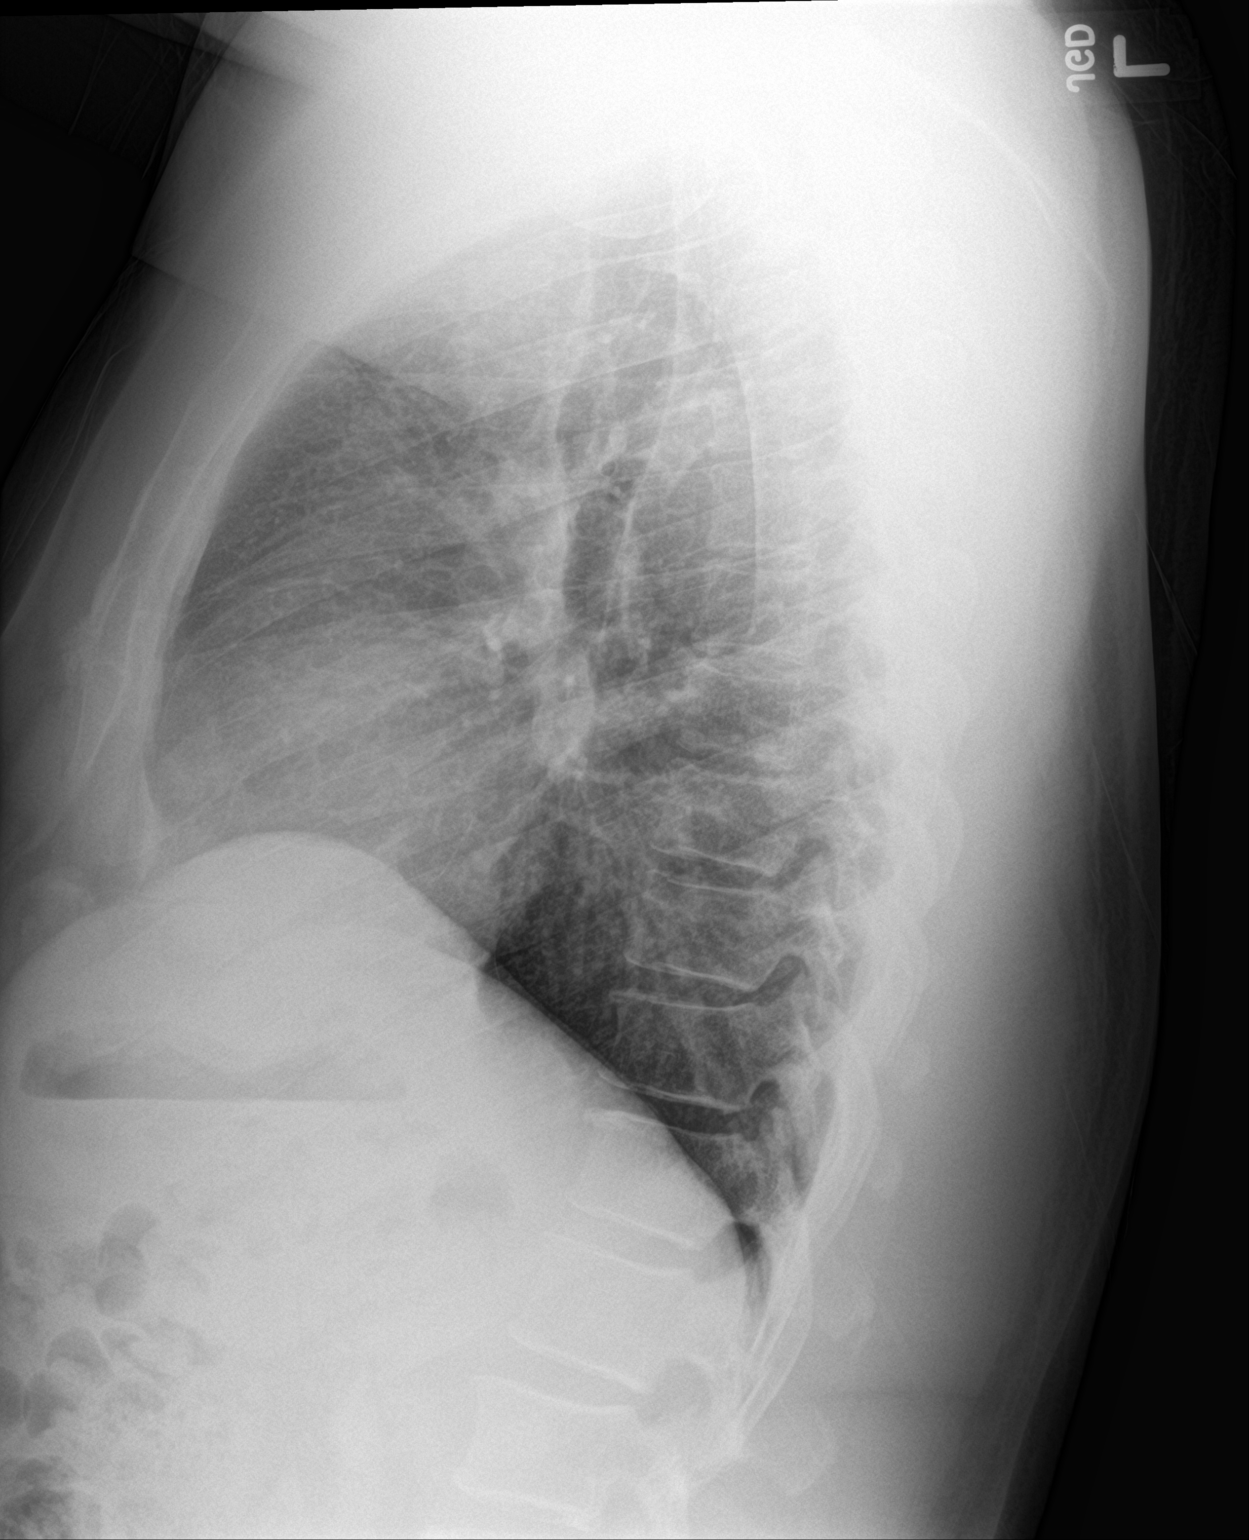

[2 of 2 positions shown; findings below may reference images not displayed]

FINDINGS: The heart size and mediastinal contours are within normal limits.
Both lungs are clear. The visualized skeletal structures are
unremarkable.
IMPRESSION: No active cardiopulmonary disease.

## 2022-11-02 ENCOUNTER — Telehealth: Payer: Self-pay | Admitting: Pulmonary Disease

## 2022-11-02 DIAGNOSIS — G4733 Obstructive sleep apnea (adult) (pediatric): Secondary | ICD-10-CM | POA: Diagnosis not present

## 2022-11-02 NOTE — Telephone Encounter (Signed)
Call patient  Sleep study result  Date of study: 10/28/2022  Impression: Severe obstructive sleep apnea Moderately severe oxygen desaturations  Recommendation: DME referral  Recommend CPAP therapy for severe obstructive sleep apnea  Auto titrating CPAP with pressure settings of 5-20 will be appropriate  Encourage weight loss measures  Follow-up in the office 4 to 6 weeks following initiation of treatment

## 2022-11-04 NOTE — Telephone Encounter (Signed)
Sleep study 10/28/22 showed severe OSA, if he is open to CPAP we can place order auto settings 5-20cm h20 with mask of choice. Needs to wear nightly 4-6 hours or longer. FU in 4-6 weeks after starting. If he has any CPAP questions or its new to him and wants a visit please schedule prior to starting use

## 2022-11-04 NOTE — Telephone Encounter (Signed)
Called and spoke with pt letting him know the results from HST. Pt stated he would rather have an OV to discuss the results in more detail so an appt has been scheduled for pt with BW. Nothing further needed.

## 2022-11-18 ENCOUNTER — Encounter: Payer: Self-pay | Admitting: Primary Care

## 2022-11-18 ENCOUNTER — Ambulatory Visit (INDEPENDENT_AMBULATORY_CARE_PROVIDER_SITE_OTHER): Payer: Medicaid Other | Admitting: Primary Care

## 2022-11-18 VITALS — BP 152/90 | HR 89 | Temp 97.3°F | Ht 69.0 in | Wt 240.2 lb

## 2022-11-18 DIAGNOSIS — I158 Other secondary hypertension: Secondary | ICD-10-CM | POA: Diagnosis not present

## 2022-11-18 DIAGNOSIS — G4730 Sleep apnea, unspecified: Secondary | ICD-10-CM | POA: Diagnosis not present

## 2022-11-18 NOTE — Progress Notes (Signed)
Reviewed and agree with assessment/plan.   Coralyn Helling, MD [facility] 11/18/2022, 10:58 AM Pager:  [phone]

## 2022-11-18 NOTE — Progress Notes (Signed)
@Patient  ID: Scott Vance, male    DOB: 1965-11-20, 57 y.o.   MRN: [id number]  Chief Complaint  Patient presents with   Follow-up    Referring provider: Norm Salt, PA  HPI: 57 year old male, current smoker. PMH significant for HTN, asthma, leukopenia, mixed hyperlipidemia.   Previous [facility] pulmonary encounter:  09/22/2022 Patient presents today for sleep consult. He has symptoms of snoring and sleep disruption. He wakes up in the middle of the night, unsure what is causing him to wake up but thinks it may be his snoring. He does not feel he gets enough sleep. Bedtime can vary between midnight-2am. He gets up most mornings at 7am. He feels fairly rested when he wakes up in the morning. He does not nap during the day.  Epworth score 2. No prior sleep studies. Denies symptoms of narcolepsy, cataplexy or sleepwalking.  Blood pressure is elevated today. He has not started his blood pressure medication. He does not want to take antihypertensive medication d.t potential for drowsiness. He would like to work on getting his blood pressure down on his own.   Sleep questionnaire Symptoms-  snoring, waking up in the middle of the night  Prior sleep study- None  Bedtime- 12am - 2am  Time to fall asleep- 30 mins Nocturnal awakenings- 1-2 times  Out of bed/start of day- 7am Weight changes- stable  Do you operate heavy machinery- no Do you currently wear CPAP- no Do you current wear oxygen- no Epworth- 2  11/18/2022- Interim hx  Patient presents today to review sleeps study results. He is sleeping better recently. He moved rooms and has a different type of bed. He is not waking up as frequently. He had a home sleep study on 10/28/22 that showed evidence severe OSA, AHI 49.6/hour with SpO2 low 69% (baseline 93%). We reviewed sleep study results and treatment options. Recommend started CPAP due to severity of his sleep apnea, patient in agreement with plan.   No Known Allergies  Immunization  History  Administered Date(s) Administered   Tdap 10/13/2020    Past Medical History:  Diagnosis Date   Acid reflux    Asthma    Asthma 09/22/2022   Hypertension 09/22/2022    Tobacco History: Social History   Tobacco Use  Smoking Status Some Days   Types: Cigarettes  Smokeless Tobacco Never  Tobacco Comments   1/2 pack a day    Ready to quit: Not Answered Counseling given: Not Answered Tobacco comments: 1/2 pack a day    Outpatient Medications Prior to Visit  Medication Sig Dispense Refill   Ascorbic Acid (VITAMIN C) 1000 MG tablet Take 1,000 mg by mouth daily.     aspirin EC 81 MG tablet Take 81 mg by mouth daily as needed (blood circulation). Swallow whole.     Misc Natural Products (YUMVS BEET ROOT-TART CHERRY PO) Take by mouth.     naproxen (NAPROSYN) 500 MG tablet Take 1 tablet (500 mg total) by mouth 2 (two) times daily as needed. 30 tablet 0   naproxen sodium (ALEVE) 220 MG tablet Take 220 mg by mouth daily as needed (pain/headache).     HYDROcodone-acetaminophen (NORCO/VICODIN) 5-325 MG tablet Take 1-2 tablets by mouth every 6 (six) hours as needed for moderate pain or severe pain. 20 tablet 0   amLODipine (NORVASC) 5 MG tablet Take 1 tablet (5 mg total) by mouth daily. (Patient not taking: Reported on 11/18/2022) 30 tablet 0   cyclobenzaprine (FLEXERIL) 10 MG tablet  Take 1 tablet (10 mg total) by mouth 2 (two) times daily as needed for muscle spasms. (Patient not taking: Reported on 11/18/2022) 10 tablet 0   dextromethorphan-guaiFENesin (MUCINEX DM) 30-600 MG 12hr tablet Take 1 tablet by mouth 2 (two) times daily. (Patient not taking: Reported on 11/18/2022) 20 tablet 0   diphenhydrAMINE HCl (ALLERGY MED PO) Take 1 tablet by mouth daily as needed (allergies). (Patient not taking: Reported on 11/18/2022)     fluticasone (FLONASE) 50 MCG/ACT nasal spray Place 1 spray into both nostrils in the morning and at bedtime. (Patient not taking: Reported on 11/18/2022) 16 mL 0    fluticasone (FLONASE) 50 MCG/ACT nasal spray Place 2 sprays into both nostrils daily. (Patient not taking: Reported on 11/18/2022) 11.1 g 2   omeprazole (PRILOSEC) 20 MG capsule Take 1 capsule (20 mg total) by mouth daily. (Patient not taking: Reported on 11/18/2022) 30 capsule 0   amoxicillin-clavulanate (AUGMENTIN) 875-125 MG tablet Take 1 tablet by mouth every 12 (twelve) hours. (Patient not taking: Reported on 11/18/2022) 14 tablet 0   azithromycin (ZITHROMAX Z-PAK) 250 MG tablet Take 2 initially, then 1 tablet daily until completed. (Patient not taking: Reported on 11/18/2022) 6 each 0   guaiFENesin (MUCUS RELIEF PO) Take 2 tablets by mouth 2 (two) times daily as needed (congestion). (Patient not taking: Reported on 11/18/2022)     pantoprazole (PROTONIX) 20 MG tablet Take 1 tablet (20 mg total) by mouth daily. (Patient not taking: Reported on 09/19/2021) 30 tablet 0   No facility-administered medications prior to visit.      Review of Systems  Review of Systems  Constitutional: Negative.   HENT: Negative.    Respiratory: Negative.    Cardiovascular: Negative.   Psychiatric/Behavioral:  Positive for sleep disturbance.      Physical Exam  BP (!) 152/90 (BP Location: Left Arm, Patient Position: Sitting, Cuff Size: Large)   Pulse 89   Temp (!) 97.3 F (36.3 C) (Temporal)   Ht 5\' 9"  (1.753 m)   Wt 240 lb 3.2 oz (109 kg)   SpO2 99%   BMI 35.47 kg/m  Physical Exam Constitutional:      Appearance: Normal appearance.  HENT:     Head: Normocephalic and atraumatic.  Cardiovascular:     Rate and Rhythm: Normal rate and regular rhythm.  Pulmonary:     Effort: Pulmonary effort is normal.     Breath sounds: Normal breath sounds.  Skin:    General: Skin is warm and dry.  Neurological:     General: No focal deficit present.     Mental Status: He is alert and oriented to person, place, and time. Mental status is at baseline.  Psychiatric:        Mood and Affect: Mood normal.         Behavior: Behavior normal.        Thought Content: Thought content normal.        Judgment: Judgment normal.      Lab Results:  CBC    Component Value Date/Time   WBC 3.6 (L) 07/19/2022 1140   RBC 4.83 07/19/2022 1140   HGB 16.1 07/19/2022 1140   HCT 44.7 07/19/2022 1140   PLT 196 07/19/2022 1140   MCV 92.5 07/19/2022 1140   MCH 33.3 07/19/2022 1140   MCHC 36.0 07/19/2022 1140   RDW 12.9 07/19/2022 1140   LYMPHSABS 1.5 07/19/2022 1140   MONOABS 0.4 07/19/2022 1140   EOSABS 0.1 07/19/2022 1140   BASOSABS 0.0  07/19/2022 1140    BMET    Component Value Date/Time   NA 137 07/19/2022 1205   K 4.1 07/19/2022 1205   CL 105 07/19/2022 1205   CO2 27 07/19/2022 1205   GLUCOSE 101 (H) 07/19/2022 1205   BUN 9 07/19/2022 1205   CREATININE 0.92 07/19/2022 1205   CALCIUM 9.4 07/19/2022 1205   GFRNONAA >60 07/19/2022 1205   GFRAA >60 12/14/2018 0950    BNP No results found for: "BNP"  ProBNP No results found for: "PROBNP"  Imaging: No results found.   Assessment & Plan:   Severe sleep apnea - Patient was seen for sleep consult in April due to loud snoring symptoms and sleep disruption.  Home sleep study on 10/28/2022 showed evidence of severe obstructive sleep apnea, AHI 49.6 an hour with SpO2 low 69% (baseline 93%).  We reviewed sleep study results, risks of untreated sleep apnea and treatment options.  Due to severity of OSA recommending patient be started on CPAP.  Patient in agreement with plan.  We have placed an order for auto CPAP 5 to 20 cm H2O with mask of choice.  Encouraged weight loss efforts, side sleeping position and caution with alcohol/sedatives prior to bedtime.  Advised against driving experiencing excessive daytime sleepiness fatigue.  Follow-up 31 to 90 days after starting CPAP for compliance check.  Hypertension - BP 150/90, not currently taking Norvasc - Advised patient follow-up with PCP   Glenford Bayley, NP 11/18/2022

## 2022-11-18 NOTE — Assessment & Plan Note (Signed)
-   Patient was seen for sleep consult in April due to loud snoring symptoms and sleep disruption.  Home sleep study on 10/28/2022 showed evidence of severe obstructive sleep apnea, AHI 49.6 an hour with SpO2 low 69% (baseline 93%).  We reviewed sleep study results, risks of untreated sleep apnea and treatment options.  Due to severity of OSA recommending patient be started on CPAP.  Patient in agreement with plan.  We have placed an order for auto CPAP 5 to 20 cm H2O with mask of choice.  Encouraged weight loss efforts, side sleeping position and caution with alcohol/sedatives prior to bedtime.  Advised against driving experiencing excessive daytime sleepiness fatigue.  Follow-up 31 to 90 days after starting CPAP for compliance check.

## 2022-11-18 NOTE — Addendum Note (Signed)
Addended by: Lanna Poche on: 11/18/2022 11:45 AM   Modules accepted: Orders

## 2022-11-18 NOTE — Assessment & Plan Note (Signed)
-   BP 150/90, not currently taking Norvasc - Advised patient follow-up with PCP

## 2022-11-18 NOTE — Patient Instructions (Addendum)
Recommendations:  - Aim to wear CPAP every night for 4-6 hours or longer  - Follow-up with PCP regarding high blood pressure/ monitor BP and log readings, if consistently >130/80   Orders: - Auto CPAP 5-20cm h20 with mask of choice (sleep study 10/28/22 re: snoring)   Follow-up: - Scheduled follow-up 31-90 for CPAP compliance with Beth NP    CPAP and BIPAP Information CPAP and BIPAP are methods that use air pressure to keep your airways open and to help you breathe well. CPAP and BIPAP use different amounts of pressure. Your health care provider will tell you whether CPAP or BIPAP would be more helpful for you. CPAP stands for "continuous positive airway pressure." With CPAP, the amount of pressure stays the same while you breathe in (inhale) and out (exhale). BIPAP stands for "bi-level positive airway pressure." With BIPAP, the amount of pressure will be higher when you inhale and lower when you exhale. This allows you to take larger breaths. CPAP or BIPAP may be used in the hospital, or your health care provider may want you to use it at home. You may need to have a sleep study before your health care provider can order a machine for you to use at home. What are the advantages? CPAP or BIPAP can be helpful if you have: Sleep apnea. Chronic obstructive pulmonary disease (COPD). Heart failure. Medical conditions that cause muscle weakness, including muscular dystrophy or amyotrophic lateral sclerosis (ALS). Other problems that cause breathing to be shallow, weak, abnormal, or difficult. CPAP and BIPAP are most commonly used for obstructive sleep apnea (OSA) to keep the airways from collapsing when the muscles relax during sleep. What are the risks? Generally, this is a safe treatment. However, problems may occur, including: Irritated skin or skin sores if the mask does not fit properly. Dry or stuffy nose or nosebleeds. Dry mouth. Feeling gassy or bloated. Sinus or lung infection if the  equipment is not cleaned properly. When should CPAP or BIPAP be used? In most cases, the mask only needs to be worn during sleep. Generally, the mask needs to be worn throughout the night and during any daytime naps. People with certain medical conditions may also need to wear the mask at other times, such as when they are awake. Follow instructions from your health care provider about when to use the machine. What happens during CPAP or BIPAP?  Both CPAP and BIPAP are provided by a small machine with a flexible plastic tube that attaches to a plastic mask that you wear. Air is blown through the mask into your nose or mouth. The amount of pressure that is used to blow the air can be adjusted on the machine. Your health care provider will set the pressure setting and help you find the best mask for you. Tips for using the mask Because the mask needs to be snug, some people feel trapped or closed-in (claustrophobic) when first using the mask. If you feel this way, you may need to get used to the mask. One way to do this is to hold the mask loosely over your nose or mouth and then gradually apply the mask more snugly. You can also gradually increase the amount of time that you use the mask. Masks are available in various types and sizes. If your mask does not fit well, talk with your health care provider about getting a different one. Some common types of masks include: Full face masks, which fit over the mouth and nose. Nasal  masks, which fit over the nose. Nasal pillow or prong masks, which fit into the nostrils. If you are using a mask that fits over your nose and you tend to breathe through your mouth, a chin strap may be applied to help keep your mouth closed. Use a skin barrier to protect your skin as told by your health care provider. Some CPAP and BIPAP machines have alarms that may sound if the mask comes off or develops a leak. If you have trouble with the mask, it is very important that you  talk with your health care provider about finding a way to make the mask easier to tolerate. Do not stop using the mask. There could be a negative impact on your health if you stop using the mask. Tips for using the machine Place your CPAP or BIPAP machine on a secure table or stand near an electrical outlet. Know where the on/off switch is on the machine. Follow instructions from your health care provider about how to set the pressure on your machine and when you should use it. Do not eat or drink while the CPAP or BIPAP machine is on. Food or fluids could get pushed into your lungs by the pressure of the CPAP or BIPAP. For home use, CPAP and BIPAP machines can be rented or purchased through home health care companies. Many different brands of machines are available. Renting a machine before purchasing may help you find out which particular machine works well for you. Your health insurance company may also decide which machine you may get. Keep the CPAP or BIPAP machine and attachments clean. Ask your health care provider for specific instructions. Check the humidifier if you have a dry stuffy nose or nosebleeds. Make sure it is working correctly. Follow these instructions at home: Take over-the-counter and prescription medicines only as told by your health care provider. Ask if you can take sinus medicine if your sinuses are blocked. Do not use any products that contain nicotine or tobacco. These products include cigarettes, chewing tobacco, and vaping devices, such as e-cigarettes. If you need help quitting, ask your health care provider. Keep all follow-up visits. This is important. Contact a health care provider if: You have redness or pressure sores on your head, face, mouth, or nose from the mask or head gear. You have trouble using the CPAP or BIPAP machine. You cannot tolerate wearing the CPAP or BIPAP mask. Someone tells you that you snore even when wearing your CPAP or BIPAP. Get help  right away if: You have trouble breathing. You feel confused. Summary CPAP and BIPAP are methods that use air pressure to keep your airways open and to help you breathe well. If you have trouble with the mask, it is very important that you talk with your health care provider about finding a way to make the mask easier to tolerate. Do not stop using the mask. There could be a negative impact to your health if you stop using the mask. Follow instructions from your health care provider about when to use the machine. This information is not intended to replace advice given to you by your health care provider. Make sure you discuss any questions you have with your health care provider. Document Revised: 01/14/2021 Document Reviewed: 05/16/2020 Elsevier Patient Education  2023 [employer].

## 2022-11-25 ENCOUNTER — Telehealth: Payer: Self-pay | Admitting: Hematology and Oncology

## 2022-11-25 NOTE — Telephone Encounter (Signed)
scheduled per referral, pt has been called and confirmed date and time. Pt is aware of location and to arrive early for check in   

## 2022-12-21 ENCOUNTER — Ambulatory Visit: Payer: Medicaid Other | Admitting: Primary Care

## 2022-12-27 ENCOUNTER — Encounter: Payer: Self-pay | Admitting: Hematology and Oncology

## 2022-12-27 ENCOUNTER — Inpatient Hospital Stay: Payer: Medicaid Other | Attending: Hematology and Oncology | Admitting: Hematology and Oncology

## 2022-12-27 ENCOUNTER — Inpatient Hospital Stay: Payer: Medicaid Other

## 2022-12-27 ENCOUNTER — Other Ambulatory Visit: Payer: Self-pay

## 2022-12-27 VITALS — BP 159/99 | HR 86 | Temp 98.2°F | Resp 17 | Wt 238.2 lb

## 2022-12-27 DIAGNOSIS — D700 Congenital agranulocytosis: Secondary | ICD-10-CM

## 2022-12-27 DIAGNOSIS — D72819 Decreased white blood cell count, unspecified: Secondary | ICD-10-CM | POA: Insufficient documentation

## 2022-12-27 DIAGNOSIS — M79632 Pain in left forearm: Secondary | ICD-10-CM | POA: Diagnosis not present

## 2022-12-27 DIAGNOSIS — F17210 Nicotine dependence, cigarettes, uncomplicated: Secondary | ICD-10-CM | POA: Insufficient documentation

## 2022-12-27 DIAGNOSIS — D751 Secondary polycythemia: Secondary | ICD-10-CM | POA: Diagnosis not present

## 2022-12-27 DIAGNOSIS — Z720 Tobacco use: Secondary | ICD-10-CM | POA: Insufficient documentation

## 2022-12-27 DIAGNOSIS — G8929 Other chronic pain: Secondary | ICD-10-CM

## 2022-12-27 LAB — CBC WITH DIFFERENTIAL (CANCER CENTER ONLY)
Abs Immature Granulocytes: 0 10*3/uL (ref 0.00–0.07)
Basophils Absolute: 0 10*3/uL (ref 0.0–0.1)
Basophils Relative: 1 %
Eosinophils Absolute: 0.1 10*3/uL (ref 0.0–0.5)
Eosinophils Relative: 4 %
HCT: 48.5 % (ref 39.0–52.0)
Hemoglobin: 17.4 g/dL — ABNORMAL HIGH (ref 13.0–17.0)
Immature Granulocytes: 0 %
Lymphocytes Relative: 45 %
Lymphs Abs: 1.5 10*3/uL (ref 0.7–4.0)
MCH: 33.7 pg (ref 26.0–34.0)
MCHC: 35.9 g/dL (ref 30.0–36.0)
MCV: 93.8 fL (ref 80.0–100.0)
Monocytes Absolute: 0.4 10*3/uL (ref 0.1–1.0)
Monocytes Relative: 12 %
Neutro Abs: 1.2 10*3/uL — ABNORMAL LOW (ref 1.7–7.7)
Neutrophils Relative %: 38 %
Platelet Count: 200 10*3/uL (ref 150–400)
RBC: 5.17 MIL/uL (ref 4.22–5.81)
RDW: 12.2 % (ref 11.5–15.5)
WBC Count: 3.1 10*3/uL — ABNORMAL LOW (ref 4.0–10.5)
nRBC: 0 % (ref 0.0–0.2)

## 2022-12-27 LAB — VITAMIN B12: Vitamin B-12: 382 pg/mL (ref 180–914)

## 2022-12-27 LAB — VITAMIN D 25 HYDROXY (VIT D DEFICIENCY, FRACTURES): Vit D, 25-Hydroxy: 29.33 ng/mL — ABNORMAL LOW (ref 30–100)

## 2022-12-27 LAB — SEDIMENTATION RATE: Sed Rate: 0 mm/hr (ref 0–16)

## 2022-12-27 NOTE — Assessment & Plan Note (Signed)
This is likely due to African-American heritage I will order some other tests including vitamin B12 and autoimmune screen

## 2022-12-27 NOTE — Assessment & Plan Note (Signed)
The patient is motivated to quit I encouraged the patient

## 2022-12-27 NOTE — Progress Notes (Signed)
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, likely due to smoking Observe only  Orders Placed This Encounter  Procedures   CBC with Differential (Cancer Center Only)    Standing Status:   Future    Number of Occurrences:   1    Standing Expiration Date:   12/27/2023   Vitamin B12    Standing Status:   Future    Number of Occurrences:   1    Standing Expiration Date:   12/27/2023   VITAMIN D 25 Hydroxy (Vit-D Deficiency, Fractures)    Standing Status:   Future    Number of Occurrences:   1    Standing Expiration Date:   12/27/2023   Sedimentation rate    Standing Status:   Future    Number of Occurrences:   1    Standing Expiration Date:   12/27/2023   ANA, IFA (with reflex)    Standing Status:   Future    Number of Occurrences:   1    Standing Expiration Date:   12/27/2023    All questions were answered. The patient knows to call the clinic with any problems, questions or concerns. I spent 55 minutes counseling the patient face to face, counseling and review of plan of care.   Artis Delay, MD 12/27/2022 12:35 PM   CHIEF COMPLAINTS/PURPOSE OF CONSULTATION:  Leukopenia  HISTORY OF PRESENTING ILLNESS:  Scott Vance 57 y.o. male is here because of leukopenia  He was found to have abnormal CBC from recent blood work.  I have the opportunity to review his CBC dated back to 2015. His white blood  cell count ranged from low normal to as low as 2.9 He denies recurrent infection. The last prescription antibiotics was 2 months ago when he had poor dentition requiring dental extraction There is not reported symptoms of sinus congestion, cough, urinary frequency/urgency or dysuria, diarrhea,  or abnormal skin rash.  He has intermittent chronic left elbow pain and had imaging study done which came back unremarkable He had no prior history or diagnosis of cancer. His age appropriate screening programs are up-to-date. The patient has no prior diagnosis of autoimmune disease and was not prescribed corticosteroids related products.  The patient is a smoker and currently smokes 1/2 pack of cigarettes per day for the last 33 years. He denies alcohol intake  MEDICAL HISTORY:  Past Medical History:  Diagnosis Date   Acid reflux    Asthma    Asthma 09/22/2022   Hypertension 09/22/2022    SURGICAL HISTORY: History reviewed. No pertinent surgical history.  SOCIAL HISTORY: Social History   Socioeconomic History   Marital status: Single    Spouse name: Not on file   Number of children: Not on file   Years of education: Not on file   Highest education level: Not on file  Occupational History   Not on file  Tobacco Use   Smoking status: Some Days    Types: Cigarettes   Smokeless tobacco: Never   Tobacco comments:    1/2 pack a day   Vaping Use   Vaping Use: Never used  Substance and Sexual Activity   Alcohol use: No   Drug use: No   Sexual activity: Not Currently  Other Topics Concern   Not on file  Social History Narrative   Not on file   Social Determinants of Health   Financial Resource Strain: Not on file  Food Insecurity: Not on file  Transportation Needs: Not on file  Physical Activity: Not on file  Stress: Not on file  Social Connections: Not on file  Intimate Partner Violence: Not on file    FAMILY HISTORY: History reviewed. No pertinent family  history.  ALLERGIES:  has No Known Allergies.  MEDICATIONS:  Current Outpatient Medications  Medication Sig Dispense Refill   amLODipine (NORVASC) 5 MG tablet Take 1 tablet (5 mg total) by mouth daily. (Patient not taking: Reported on 11/18/2022) 30 tablet 0   Ascorbic Acid (VITAMIN C) 1000 MG tablet Take 1,000 mg by mouth daily.     aspirin EC 81 MG tablet Take 81 mg by mouth daily as needed (blood circulation). Swallow whole.     No current facility-administered medications for this visit.    REVIEW OF SYSTEMS:   Constitutional: Denies fevers, chills or abnormal night sweats Eyes: Denies blurriness of vision, double vision or watery eyes Ears, nose, mouth, throat, and face: Denies mucositis or sore throat Respiratory: Denies cough, dyspnea or wheezes Cardiovascular: Denies palpitation, chest discomfort or lower extremity swelling Gastrointestinal:  Denies nausea, heartburn or change in bowel habits Skin: Denies abnormal skin rashes Lymphatics: Denies new lymphadenopathy or easy bruising Neurological:Denies numbness, tingling or new weaknesses Behavioral/Psych: Mood is stable, no new changes  All other systems were reviewed with the patient and are negative.  PHYSICAL EXAMINATION: ECOG PERFORMANCE STATUS: 1 - Symptomatic but completely ambulatory  Vitals:   12/27/22 1142  BP: (!) 159/99  Pulse: 86  Resp: 17  Temp: 98.2 F (36.8 C)  SpO2: 100%   Filed Weights   12/27/22 1142  Weight: 238 lb 3.2 oz (108 kg)    GENERAL:alert, no distress and comfortable SKIN: skin color, texture, turgor are normal, no rashes or significant lesions EYES: normal, conjunctiva are pink and non-injected, sclera clear OROPHARYNX:no exudate, no erythema and lips, buccal mucosa, and tongue normal.  Noted poor dentition NECK: supple, thyroid normal size, non-tender, without nodularity LYMPH:  no palpable lymphadenopathy in the cervical, axillary or inguinal LUNGS: clear to auscultation and  percussion with normal breathing effort HEART: regular rate & rhythm and no murmurs and no lower extremity edema ABDOMEN:abdomen soft, non-tender and normal bowel sounds Musculoskeletal:no cyanosis of digits and no clubbing  PSYCH: alert & oriented x 3 with fluent speech NEURO: no focal motor/sensory deficits  LABORATORY DATA:  I have reviewed the data as listed Recent Results (from the past 2160 hour(s))  CBC with Differential (Cancer Center Only)     Status: Abnormal   Collection Time: 12/27/22 11:59 AM  Result Value Ref Range   WBC Count 3.1 (L) 4.0 - 10.5 K/uL   RBC 5.17 4.22 - 5.81 MIL/uL   Hemoglobin 17.4 (H) 13.0 - 17.0 g/dL   HCT 16.1 09.6 - 04.5 %   MCV 93.8 80.0 - 100.0 fL   MCH 33.7 26.0 - 34.0 pg   MCHC 35.9 30.0 - 36.0 g/dL  RDW 12.2 11.5 - 15.5 %   Platelet Count 200 150 - 400 K/uL   nRBC 0.0 0.0 - 0.2 %   Neutrophils Relative % 38 %   Neutro Abs 1.2 (L) 1.7 - 7.7 K/uL   Lymphocytes Relative 45 %   Lymphs Abs 1.5 0.7 - 4.0 K/uL   Monocytes Relative 12 %   Monocytes Absolute 0.4 0.1 - 1.0 K/uL   Eosinophils Relative 4 %   Eosinophils Absolute 0.1 0.0 - 0.5 K/uL   Basophils Relative 1 %   Basophils Absolute 0.0 0.0 - 0.1 K/uL   Immature Granulocytes 0 %   Abs Immature Granulocytes 0.00 0.00 - 0.07 K/uL    Comment: Performed at [facility], [address] [place], [place] [address]

## 2022-12-27 NOTE — Assessment & Plan Note (Signed)
His hemoglobin is high, likely due to smoking Observe only

## 2022-12-27 NOTE — Assessment & Plan Note (Signed)
His exam is benign It could be due to his C5 problem noted from prior CT I will order a vitamin D level I recommend the patient to consult his primary care doctor for referral to see orthopedic surgeon if needed

## 2022-12-28 ENCOUNTER — Telehealth: Payer: Self-pay | Admitting: Hematology and Oncology

## 2022-12-28 NOTE — Telephone Encounter (Signed)
I reviewed test results with the patient.  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  Vitamin D level was low and I suggest the patient to take vitamin D supplement.  His B12 level was normal.  He does not need long-term follow-up.

## 2022-12-29 ENCOUNTER — Telehealth: Payer: Self-pay | Admitting: Hematology and Oncology

## 2022-12-29 ENCOUNTER — Other Ambulatory Visit: Payer: Self-pay | Admitting: Hematology and Oncology

## 2022-12-29 DIAGNOSIS — M79632 Pain in left forearm: Secondary | ICD-10-CM

## 2022-12-29 DIAGNOSIS — R768 Other specified abnormal immunological findings in serum: Secondary | ICD-10-CM

## 2022-12-29 LAB — ANTINUCLEAR ANTIBODIES, IFA: ANA Ab, IFA: POSITIVE — AB

## 2022-12-29 LAB — FANA STAINING PATTERNS: CENTROMERE AB: 3 — ABNORMAL HIGH

## 2022-12-29 NOTE — Telephone Encounter (Signed)
His ANA screen came back positive, could explain his joint pain I called him and he agrees for rheumatology referral

## 2023-02-01 ENCOUNTER — Ambulatory Visit: Payer: Medicaid Other | Admitting: Primary Care

## 2023-02-01 NOTE — Progress Notes (Deleted)
@Patient  ID: Scott Vance, male    DOB: 1966/01/22, 57 y.o.   MRN: [id number]  No chief complaint on file.   Referring provider: Norm Salt, PA  HPI: 57 year old male, current smoker. PMH significant for HTN, asthma, leukopenia, mixed hyperlipidemia.   Previous [facility] pulmonary encounter:  09/22/2022 Patient presents today for sleep consult. He has symptoms of snoring and sleep disruption. He wakes up in the middle of the night, unsure what is causing him to wake up but thinks it may be his snoring. He does not feel he gets enough sleep. Bedtime can vary between midnight-2am. He gets up most mornings at 7am. He feels fairly rested when he wakes up in the morning. He does not nap during the day.  Epworth score 2. No prior sleep studies. Denies symptoms of narcolepsy, cataplexy or sleepwalking.  Blood pressure is elevated today. He has not started his blood pressure medication. He does not want to take antihypertensive medication d.t potential for drowsiness. He would like to work on getting his blood pressure down on his own.   Sleep questionnaire Symptoms-  snoring, waking up in the middle of the night  Prior sleep study- None  Bedtime- 12am - 2am  Time to fall asleep- 30 mins Nocturnal awakenings- 1-2 times  Out of bed/start of day- 7am Weight changes- stable  Do you operate heavy machinery- no Do you currently wear CPAP- no Do you current wear oxygen- no Epworth- 2  11/18/2022 Patient presents today to review sleeps study results. He is sleeping better recently. He moved rooms and has a different type of bed. He is not waking up as frequently. He had a home sleep study on 10/28/22 that showed evidence severe OSA, AHI 49.6/hour with SpO2 low 69% (baseline 93%). We reviewed sleep study results and treatment options. Recommend started CPAP due to severity of his sleep apnea, patient in agreement with plan.   02/01/2023- Interim hx  Patient presents today for a 47-month follow-up.   He was seen for sleep consult in April due to loud snoring symptoms and sleep disruption.  Home sleep study on 10/28/2022 showed evidence of severe obstructive sleep apnea, AHI 49.6 an hour with SpO2 low 69% (baseline 93%).  Patient was started on auto CPAP in May 2024.  Current pressure setting 5 to 20 cm H2O        No Known Allergies  Immunization History  Administered Date(s) Administered   Tdap 10/13/2020    Past Medical History:  Diagnosis Date   Acid reflux    Asthma    Asthma 09/22/2022   Hypertension 09/22/2022    Tobacco History: Social History   Tobacco Use  Smoking Status Some Days   Types: Cigarettes  Smokeless Tobacco Never  Tobacco Comments   1/2 pack a day    Ready to quit: Not Answered Counseling given: Not Answered Tobacco comments: 1/2 pack a day    Outpatient Medications Prior to Visit  Medication Sig Dispense Refill   amLODipine (NORVASC) 5 MG tablet Take 1 tablet (5 mg total) by mouth daily. (Patient not taking: Reported on 11/18/2022) 30 tablet 0   Ascorbic Acid (VITAMIN C) 1000 MG tablet Take 1,000 mg by mouth daily.     aspirin EC 81 MG tablet Take 81 mg by mouth daily as needed (blood circulation). Swallow whole.     No facility-administered medications prior to visit.      Review of Systems  Review of Systems   Physical  Exam  There were no vitals taken for this visit. Physical Exam   Lab Results:  CBC    Component Value Date/Time   WBC 3.1 (L) 12/27/2022 1159   WBC 3.6 (L) 07/19/2022 1140   RBC 5.17 12/27/2022 1159   HGB 17.4 (H) 12/27/2022 1159   HCT 48.5 12/27/2022 1159   PLT 200 12/27/2022 1159   MCV 93.8 12/27/2022 1159   MCH 33.7 12/27/2022 1159   MCHC 35.9 12/27/2022 1159   RDW 12.2 12/27/2022 1159   LYMPHSABS 1.5 12/27/2022 1159   MONOABS 0.4 12/27/2022 1159   EOSABS 0.1 12/27/2022 1159   BASOSABS 0.0 12/27/2022 1159    BMET    Component Value Date/Time   NA 137 07/19/2022 1205   K 4.1 07/19/2022 1205    CL 105 07/19/2022 1205   CO2 27 07/19/2022 1205   GLUCOSE 101 (H) 07/19/2022 1205   BUN 9 07/19/2022 1205   CREATININE 0.92 07/19/2022 1205   CALCIUM 9.4 07/19/2022 1205   GFRNONAA >60 07/19/2022 1205   GFRAA >60 12/14/2018 0950    BNP No results found for: "BNP"  ProBNP No results found for: "PROBNP"  Imaging: No results found.   Assessment & Plan:   No problem-specific Assessment & Plan notes found for this encounter.     Glenford Bayley, NP 02/01/2023

## 2023-02-17 ENCOUNTER — Encounter: Payer: Self-pay | Admitting: Emergency Medicine

## 2023-02-17 ENCOUNTER — Ambulatory Visit
Admission: EM | Admit: 2023-02-17 | Discharge: 2023-02-17 | Disposition: A | Discharge status: Home / Self Care | Payer: Medicaid Other | Attending: Internal Medicine | Admitting: Internal Medicine

## 2023-02-17 DIAGNOSIS — Z20822 Contact with and (suspected) exposure to covid-19: Secondary | ICD-10-CM | POA: Insufficient documentation

## 2023-02-17 LAB — SARS CORONAVIRUS 2 (TAT 6-24 HRS): SARS Coronavirus 2: POSITIVE — AB

## 2023-02-17 NOTE — ED Triage Notes (Signed)
Pt had some sweating on Sunday. Had two covid test one was positive and one was negative.

## 2023-02-17 NOTE — ED Provider Notes (Signed)
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 male.   Patient presents today for COVID testing.  Reports that he had 1 positive COVID test and 1 negative COVID test at home yesterday.  Patient reports he is currently asymptomatic but did have an episode of shortness of breath and "sweating" approximately 5 days ago.  Denies any known sick contacts or fever. Denies history of asthma or COPD but patient does smoke cigarettes.     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 No pertinent surgical history.     Home Medications    Prior to Admission medications   Medication Sig Start Date End Date Taking? Authorizing Provider  amLODipine (NORVASC) 5 MG tablet Take 1 tablet (5 mg total) by mouth daily. Patient not taking: Reported on 11/18/2022 07/19/22   Glynn Octave, MD  Ascorbic Acid (VITAMIN C) 1000 MG tablet Take 1,000 mg by mouth daily.    [provider]  aspirin EC 81 MG tablet Take 81 mg by mouth daily as needed (blood circulation). Swallow whole.    [provider]  dicyclomine (BENTYL) 20 MG tablet Take 1 tablet (20 mg total) by mouth 2 (two) times daily. Patient not taking: Reported on 12/14/2018 10/15/17 03/20/19  Arthor Captain, PA-C  famotidine (PEPCID) 20 MG tablet Take 1 tablet (20 mg total) by mouth 2 (two) times daily. Patient not taking: Reported on 12/14/2018 10/15/17 03/20/19  Arthor Captain, PA-C   simethicone (MYLICON) 80 MG chewable tablet Chew 80 mg by mouth every 6 (six) hours as needed for flatulence.  03/20/19  [provider]  sucralfate (CARAFATE) 1 g tablet Take 1 tablet (1 g total) by mouth 3 (three) times daily. Patient not taking: Reported on 12/14/2018 10/15/17 03/20/19  Arthor Captain, PA-C    Family History No family history on file.  Social History Social History   Tobacco Use   Smoking status: Some Days    Types: Cigarettes   Smokeless tobacco: Never   Tobacco comments:    1/2 pack a day   Vaping Use   Vaping status: Never Used  Substance Use Topics   Alcohol use: No   Drug use: No     Allergies   Patient has no known allergies.   Review of Systems Review of Systems Per HPI  Physical Exam Triage Vital Signs ED Triage Vitals  Encounter Vitals Group     BP 02/17/23 0923 (!) 141/84     Systolic BP Percentile --      Diastolic BP Percentile --      Pulse Rate 02/17/23 0923 82     Resp 02/17/23 0923 16     Temp 02/17/23 0923 98.3 F (36.8 C)     Temp src --      SpO2 02/17/23 0923 97 %     Weight --  Height --      Head Circumference --      Peak Flow --      Pain Score 02/17/23 0922 0     Pain Loc --      Pain Education --      Exclude from Growth Chart --    No data found.  Updated Vital Signs BP (!) 141/84 (BP Location: Left Arm)   Pulse 82   Temp 98.3 F (36.8 C)   Resp 16   SpO2 97%   Visual Acuity Right Eye Distance:   Left Eye Distance:   Bilateral Distance:    Right Eye Near:   Left Eye Near:    Bilateral Near:     Physical Exam Constitutional:      General: He is not in acute distress.    Appearance: Normal appearance. He is not toxic-appearing or diaphoretic.  HENT:     Head: Normocephalic and atraumatic.  Eyes:     Extraocular Movements: Extraocular movements intact.     Conjunctiva/sclera: Conjunctivae normal.  Cardiovascular:     Rate and Rhythm: Normal rate and regular rhythm.     Pulses:  Normal pulses.     Heart sounds: Normal heart sounds.  Pulmonary:     Effort: Pulmonary effort is normal. No respiratory distress.     Breath sounds: Normal breath sounds. No stridor. No wheezing, rhonchi or rales.  Neurological:     General: No focal deficit present.     Mental Status: He is alert and oriented to person, place, and time. Mental status is at baseline.  Psychiatric:        Mood and Affect: Mood normal.        Behavior: Behavior normal.        Thought Content: Thought content normal.        Judgment: Judgment normal.      UC Treatments / Results  Labs (all labs ordered are listed, but only abnormal results are displayed) Labs Reviewed  SARS CORONAVIRUS 2 (TAT 6-24 HRS)    EKG   Radiology No results found.  Procedures Procedures (including critical care time)  Medications Ordered in UC Medications - No data to display  Initial Impression / Assessment and Plan / UC Course  I have reviewed the triage vital signs and the nursing notes.  Pertinent labs & imaging results that were available during my care of the patient were reviewed by me and considered in my medical decision making (see chart for details).     Patient presenting for COVID testing and is asymptomatic.  He reports an episode of shortness of breath approximately 5 days ago but physical exam is benign with no adventitious lung sounds.  Advised COVID precautions.  COVID test pending.  Advised strict return precautions.  Patient verbalized understanding and was agreeable with plan. Final Clinical Impressions(s) / UC Diagnoses   Final diagnoses:  Encounter for laboratory testing for COVID-19 virus     Discharge Instructions      COVID test is pending.  We will call if it is positive.    ED Prescriptions   None    PDMP not reviewed this encounter.   Gustavus Bryant, [place] 02/17/23 [phone]

## 2023-02-17 NOTE — Discharge Instructions (Signed)
COVID test is pending.  We will call if it is positive. 

## 2023-03-04 ENCOUNTER — Ambulatory Visit
Admission: EM | Admit: 2023-03-04 | Discharge: 2023-03-04 | Disposition: A | Discharge status: Home / Self Care | Payer: Medicaid Other

## 2023-03-04 DIAGNOSIS — B029 Zoster without complications: Secondary | ICD-10-CM

## 2023-03-04 MED ORDER — VALACYCLOVIR HCL 1 G PO TABS: 1000.0000 mg | ORAL_TABLET | Freq: Three times a day (TID) | ORAL | 0 refills | Status: DC

## 2023-03-04 NOTE — ED Provider Notes (Signed)
[facility]    CSN: [id number] Arrival date & time: 03/04/23  1129      History   Chief Complaint Chief Complaint  Patient presents with   Rash    HPI Scott Vance is a 57 y.o. male.   Patient here today for evaluation of tenderness to his right lower back that wraps around to his right lower abdomen.  He states that he is not sure if he was bit by something.  He has not any fever.  He denies any symptoms on the left.  He states that the skin seems sensitive to the right.  He does have a small rash to his right lower back.  The history is provided by the patient.  Rash Associated symptoms: no fever and no shortness of breath     Past Medical History:  Diagnosis Date   Acid reflux    Asthma    Asthma 09/22/2022   Hypertension 09/22/2022    Patient Active Problem List   Diagnosis Date Noted   Tobacco abuse 12/27/2022   Left forearm pain 12/27/2022   Polycythemia, secondary 12/27/2022   Mixed hyperlipidemia 09/22/2022   Leukopenia 09/22/2022   Asthma 09/22/2022   Hypertension 09/22/2022   Severe sleep apnea 09/22/2022    History reviewed. No pertinent surgical history.     Home Medications    Prior to Admission medications   Medication Sig Start Date End Date Taking? Authorizing Provider  amLODipine (NORVASC) 5 MG tablet Take 1 tablet (5 mg total) by mouth daily. 07/19/22  Yes Rancour, Jeannett Senior, MD  CRESTOR 20 MG tablet Take 20 mg by mouth daily. 12/06/22  Yes [provider]  DRISDOL 1.25 MG (50000 UT) capsule Take 50,000 Units by mouth once a week. 12/31/22  Yes [provider]  valACYclovir (VALTREX) 1000 MG tablet Take 1 tablet (1,000 mg total) by mouth 3 (three) times daily. 03/04/23  Yes Tomi Bamberger, PA-C  Ascorbic Acid (VITAMIN C) 1000 MG tablet Take 1,000 mg by mouth daily.    [provider]  aspirin EC 81 MG tablet Take 81 mg by mouth daily as needed (blood circulation). Swallow whole.    [provider]  dicyclomine (BENTYL) 20 MG tablet Take 1 tablet (20 mg total) by mouth 2 (two) times daily. Patient not taking: Reported on 12/14/2018 10/15/17 03/20/19  Arthor Captain, PA-C  famotidine (PEPCID) 20 MG tablet Take 1 tablet (20 mg total) by mouth 2 (two) times daily. Patient not taking: Reported on 12/14/2018 10/15/17 03/20/19  Arthor Captain, PA-C  simethicone (MYLICON) 80 MG chewable tablet Chew 80 mg by mouth every 6 (six) hours as needed for flatulence.  03/20/19  [provider]  sucralfate (CARAFATE) 1 g tablet Take 1 tablet (1 g total) by mouth 3 (three) times daily. Patient not taking: Reported on 12/14/2018 10/15/17 03/20/19  Arthor Captain, PA-C    Family History History reviewed. No pertinent family history.  Social History Social History   Tobacco Use   Smoking status: Some Days    Types: Cigarettes   Smokeless tobacco: Never   Tobacco comments:    1/2 pack a day   Vaping Use   Vaping status: Never Used  Substance Use Topics   Alcohol use: No   Drug use: No     Allergies   Patient has no known allergies.   Review of Systems Review of Systems  Constitutional:  Negative for chills and fever.  Eyes:  Negative for discharge and  redness.  Respiratory:  Negative for shortness of breath.   Skin:  Positive for rash.  Neurological:  Negative for numbness.     Physical Exam Triage Vital Signs ED Triage Vitals  Encounter Vitals Group     BP 03/04/23 1154 137/77     Systolic BP Percentile --      Diastolic BP Percentile --      Pulse Rate 03/04/23 1154 82     Resp 03/04/23 1154 18     Temp 03/04/23 1154 97.8 F (36.6 C)     Temp Source 03/04/23 1154 Oral     SpO2 03/04/23 1154 99 %     Weight 03/04/23 1152 230 lb (104.3 kg)     Height 03/04/23 1152 5\' 9"  (1.753 m)     Head Circumference --      Peak Flow --      Pain Score 03/04/23 1149 5     Pain Loc --      Pain Education --      Exclude from Growth Chart --    No data  found.  Updated Vital Signs BP 137/77 (BP Location: Left Arm)   Pulse 82   Temp 97.8 F (36.6 C) (Oral)   Resp 18   Ht 5\' 9"  (1.753 m)   Wt 230 lb (104.3 kg)   SpO2 99%   BMI 33.97 kg/m      Physical Exam Vitals and nursing note reviewed.  Constitutional:      General: He is not in acute distress.    Appearance: Normal appearance. He is not ill-appearing.  HENT:     Head: Normocephalic and atraumatic.  Eyes:     Conjunctiva/sclera: Conjunctivae normal.  Cardiovascular:     Rate and Rhythm: Normal rate.  Pulmonary:     Effort: Pulmonary effort is normal. No respiratory distress.  Skin:    Comments: Mildly erythematous papulovesicular rash noted in a cluster to right lower back  Neurological:     Mental Status: He is alert.  Psychiatric:        Mood and Affect: Mood normal.        Behavior: Behavior normal.        Thought Content: Thought content normal.      UC Treatments / Results  Labs (all labs ordered are listed, but only abnormal results are displayed) Labs Reviewed - No data to display  EKG   Radiology No results found.  Procedures Procedures (including critical care time)  Medications Ordered in UC Medications - No data to display  Initial Impression / Assessment and Plan / UC Course  I have reviewed the triage vital signs and the nursing notes.  Pertinent labs & imaging results that were available during my care of the patient were reviewed by me and considered in my medical decision making (see chart for details).    Symptoms consistent with shingles.  Valtrex prescribed.  Recommend follow-up if no gradual improvement with any further concerns.  Final Clinical Impressions(s) / UC Diagnoses   Final diagnoses:  Herpes zoster without complication   Discharge Instructions   None    ED Prescriptions     Medication Sig Dispense Auth. Provider   valACYclovir (VALTREX) 1000 MG tablet Take 1 tablet (1,000 mg total) by mouth 3 (three) times  daily. 21 tablet Tomi Bamberger, PA-C      PDMP not reviewed this encounter.   Tomi Bamberger, PA-C 03/04/23 1704

## 2023-03-04 NOTE — ED Triage Notes (Signed)
"  I am feeling sore on my right lower back and having pain now in that area that extends around to stomach from a rash". "I think I was bitten by something". No fever. Unknown history of Varicella (disease), "I believe I got the Shot".

## 2023-05-20 NOTE — Progress Notes (Unsigned)
Office Visit Note  Patient: Scott Vance             Date of Birth: [birth year]           MRN: [id number]             PCP: Norm Salt, PA Referring: Artis Delay, MD Visit Date: 06/02/2023 Occupation: @GUAROCC @  Subjective:  No chief complaint on file.   History of Present Illness: Scott Vance is a 57 y.o. male ***     Activities of Daily Living:  Patient reports morning stiffness for *** {minute/hour:19697}.   Patient {ACTIONS;DENIES/REPORTS:21021675::"Denies"} nocturnal pain.  Difficulty dressing/grooming: {ACTIONS;DENIES/REPORTS:21021675::"Denies"} Difficulty climbing stairs: {ACTIONS;DENIES/REPORTS:21021675::"Denies"} Difficulty getting out of chair: {ACTIONS;DENIES/REPORTS:21021675::"Denies"} Difficulty using hands for taps, buttons, cutlery, and/or writing: {ACTIONS;DENIES/REPORTS:21021675::"Denies"}  No Rheumatology ROS completed.   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 No past surgical history on file. Social History   Social History Narrative   Not on file   Immunization History  Administered Date(s) Administered   Tdap 10/13/2020     Objective: Vital Signs: There were no vitals taken for this visit.   Physical Exam   Musculoskeletal Exam: ***  CDAI Exam: CDAI Score: -- Patient Global: --; Provider Global: -- Swollen: --; Tender: -- Joint Exam 06/02/2023   No joint exam has been documented for this visit   There is currently no information documented on the homunculus. Go to the Rheumatology activity and complete the homunculus  joint exam.  Investigation: No additional findings.  Imaging: No results found.  Recent Labs: Lab Results  Component Value Date   WBC 3.1 (L) 12/27/2022   HGB 17.4 (H) 12/27/2022   PLT 200 12/27/2022   NA 137 07/19/2022   K 4.1 07/19/2022   CL 105 07/19/2022   CO2 27 07/19/2022   GLUCOSE 101 (H) 07/19/2022   BUN 9 07/19/2022   CREATININE 0.92 07/19/2022   BILITOT 0.7 03/13/2022   ALKPHOS 76 03/13/2022   AST 20 03/13/2022   ALT 16 03/13/2022   PROT 7.1 03/13/2022   ALBUMIN 4.3 03/13/2022   CALCIUM 9.4 07/19/2022   GFRAA >60 12/14/2018   July 19, 2022 x-rays of bilateral feet showed a small calcaneal spur per radiology report. July 19, 2022 x-rays of the left elbow joint negative per radiology report. July 02, 2022 x-rays of the right elbow negative per radiology report. July 02, 2022 CT of the cervical spine showed mild C4-C5 degenerative changes per radiology report. December 27, 2022 ANA positive centromere pattern, ESR 0, B12 normal, vitamin D 29.33 May 10, 2021 RMSF negative, Lyme negative Speciality Comments: No specialty comments available.  Procedures:  No procedures performed Allergies: Patient has no known allergies.   Assessment / Plan:     Visit Diagnoses: No diagnosis found.  Orders: No orders of the defined types were placed in this encounter.  No orders of the defined types were placed in this encounter.   Face-to-face time spent with patient was *** minutes. Greater than 50% of time was spent in counseling and coordination  of care.  Follow-Up Instructions: No follow-ups on file.   Pollyann Savoy, MD  Note - This record has been created using Animal nutritionist.  Chart creation errors have been sought, but may not always  have been located. Such creation errors do not reflect on  the standard of medical care.

## 2023-06-02 ENCOUNTER — Encounter: Payer: Medicaid Other | Admitting: Rheumatology

## 2023-06-02 DIAGNOSIS — E782 Mixed hyperlipidemia: Secondary | ICD-10-CM

## 2023-06-02 DIAGNOSIS — D751 Secondary polycythemia: Secondary | ICD-10-CM

## 2023-06-02 DIAGNOSIS — M79632 Pain in left forearm: Secondary | ICD-10-CM

## 2023-06-02 DIAGNOSIS — I10 Essential (primary) hypertension: Secondary | ICD-10-CM

## 2023-06-02 DIAGNOSIS — Z720 Tobacco use: Secondary | ICD-10-CM

## 2023-06-02 DIAGNOSIS — D708 Other neutropenia: Secondary | ICD-10-CM

## 2023-06-02 DIAGNOSIS — R768 Other specified abnormal immunological findings in serum: Secondary | ICD-10-CM

## 2023-06-02 DIAGNOSIS — J45909 Unspecified asthma, uncomplicated: Secondary | ICD-10-CM

## 2023-06-02 DIAGNOSIS — M5030 Other cervical disc degeneration, unspecified cervical region: Secondary | ICD-10-CM

## 2023-06-02 DIAGNOSIS — G4730 Sleep apnea, unspecified: Secondary | ICD-10-CM

## 2023-09-14 DIAGNOSIS — Z1211 Encounter for screening for malignant neoplasm of colon: Secondary | ICD-10-CM | POA: Insufficient documentation

## 2023-10-02 ENCOUNTER — Encounter (HOSPITAL_COMMUNITY): Payer: Self-pay | Admitting: Emergency Medicine

## 2023-10-02 ENCOUNTER — Ambulatory Visit (HOSPITAL_COMMUNITY): Admission: EM | Admit: 2023-10-02 | Discharge: 2023-10-02 | Disposition: A | Discharge status: Home / Self Care

## 2023-10-02 DIAGNOSIS — K6289 Other specified diseases of anus and rectum: Secondary | ICD-10-CM

## 2023-10-02 DIAGNOSIS — K648 Other hemorrhoids: Secondary | ICD-10-CM

## 2023-10-02 MED ORDER — HYDROCORTISONE (PERIANAL) 2.5 % EX CREA: 1.0000 | TOPICAL_CREAM | Freq: Two times a day (BID) | CUTANEOUS | 0 refills | Status: DC

## 2023-10-02 NOTE — ED Triage Notes (Signed)
 Pt c/o hemorrhoids "for while" several months. Reports pain and itching at times. Reports little bit blood with BM. Was using OTC hemorrhoid cream.

## 2023-10-02 NOTE — ED Provider Notes (Signed)
 [facility]    CSN: [id number] Arrival date & time: 10/02/23  1604      History   Chief Complaint Chief Complaint  Patient presents with   Hemorrhoids    HPI Scott Vance is a 58 y.o. male.   58 year old male who presents urgent care with complaints of rectal burning and mucus.  This has been going on for several months now.  He just saw gastroenterology for the same symptoms in late March and they recommended a colonoscopy which he has not scheduled yet.  He reports that he gets burning when he has a bowel movement intermittently.  He has noticed a small amount of blood when he wipes as well.  He reports that certain foods seem to make it worse with increased and burning.  He has fiber supplements at home but has not been taking them.  He does sit on the toilet for long periods of time.  He does not have external hemorrhoids that he knows of and does recall the gastroenterologist saying that he had internal hemorrhoids and that he may need banding at some point.  He has not used any prescription medication for his hemorrhoids but has been using over-the-counter medication.  He does relate some constipation at times with larger bowel movements.       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 No pertinent surgical history.     Home Medications    Prior to Admission medications   Medication Sig Start Date End Date Taking? Authorizing Provider  cholecalciferol (VITAMIN D3) 25 MCG (1000 UNIT) tablet Take by mouth. 08/17/23  Yes [provider]  hydrocortisone (ANUSOL-HC) 2.5 %  rectal cream Place 1 Application rectally 2 (two) times daily. 10/02/23  Yes Willian Donson A, PA-C  amLODipine (NORVASC) 5 MG tablet Take 1 tablet (5 mg total) by mouth daily. 07/19/22   Rancour, Mara [place], MD  amLODipine-valsartan (EXFORGE) 10-320 MG tablet Take 1 tablet by mouth daily.    [provider]  Ascorbic Acid (VITAMIN C) 1000 MG tablet Take 1,000 mg by mouth daily.    [provider]  aspirin EC 81 MG tablet Take 81 mg by mouth daily as needed (blood circulation). Swallow whole.    [provider]  CRESTOR 20 MG tablet Take 20 mg by mouth daily. 12/06/22   [provider]  DRISDOL 1.25 MG (50000 UT) capsule Take 50,000 Units by mouth once a week. 12/31/22   [provider]  valACYclovir (VALTREX) 1000 MG tablet Take 1 tablet (1,000 mg total) by mouth 3 (three) times daily. 03/04/23   Vernestine Gondola, PA-C  dicyclomine (BENTYL) 20 MG tablet Take 1 tablet (20 mg total) by mouth 2 (two) times daily. Patient not taking: Reported on 12/14/2018 10/15/17 03/20/19  Harris, Abigail, PA-C  famotidine (PEPCID) 20 MG tablet Take 1 tablet (20 mg total) by mouth 2 (two) times daily. Patient not taking: Reported on 12/14/2018 10/15/17 03/20/19  Harris, Abigail, PA-C  simethicone (MYLICON) 80 MG chewable tablet Chew 80 mg by mouth every 6 (six) hours as needed  for flatulence.  03/20/19  [provider]  sucralfate (CARAFATE) 1 g tablet Take 1 tablet (1 g total) by mouth 3 (three) times daily. Patient not taking: Reported on 12/14/2018 10/15/17 03/20/19  Harris, Abigail, PA-C    Family History No family history on file.  Social History Social History   Tobacco Use   Smoking status: Some Days    Types: Cigarettes   Smokeless tobacco: Never   Tobacco comments:    1/2 pack a day   Vaping Use   Vaping status: Never Used  Substance Use Topics   Alcohol use: No   Drug use: No     Allergies   Patient has no known allergies.   Review of  Systems Review of Systems  Constitutional:  Negative for chills and fever.  HENT:  Negative for ear pain and sore throat.   Eyes:  Negative for pain and visual disturbance.  Respiratory:  Negative for cough and shortness of breath.   Cardiovascular:  Negative for chest pain and palpitations.  Gastrointestinal:  Positive for anal bleeding (Small amount when wiping) and rectal pain. Negative for abdominal pain and vomiting.  Genitourinary:  Negative for dysuria and hematuria.  Musculoskeletal:  Negative for arthralgias and back pain.  Skin:  Negative for color change and rash.  Neurological:  Negative for seizures and syncope.  All other systems reviewed and are negative.    Physical Exam Triage Vital Signs ED Triage Vitals  Encounter Vitals Group     BP 10/02/23 1618 131/76     Systolic BP Percentile --      Diastolic BP Percentile --      Pulse Rate 10/02/23 1618 87     Resp 10/02/23 1618 17     Temp 10/02/23 1618 (!) 97.5 F (36.4 C)     Temp Source 10/02/23 1618 Oral     SpO2 10/02/23 1618 97 %     Weight --      Height --      Head Circumference --      Peak Flow --      Pain Score 10/02/23 1616 5     Pain Loc --      Pain Education --      Exclude from Growth Chart --    No data found.  Updated Vital Signs BP 131/76 (BP Location: Right Arm)   Pulse 87   Temp (!) 97.5 F (36.4 C) (Oral)   Resp 17   SpO2 97%   Visual Acuity Right Eye Distance:   Left Eye Distance:   Bilateral Distance:    Right Eye Near:   Left Eye Near:    Bilateral Near:     Physical Exam Vitals and nursing note reviewed.  Constitutional:      General: He is not in acute distress.    Appearance: He is well-developed.  HENT:     Head: Normocephalic and atraumatic.  Eyes:     Conjunctiva/sclera: Conjunctivae normal.  Cardiovascular:     Rate and Rhythm: Normal rate and regular rhythm.     Heart sounds: No murmur heard. Pulmonary:     Effort: Pulmonary effort is normal. No  respiratory distress.     Breath sounds: Normal breath sounds.  Abdominal:     Palpations: Abdomen is soft.     Tenderness: There is no abdominal tenderness.  Genitourinary:    Comments: No external hemorrhoids present, no protrusion of hemorrhoids with Valsalva Musculoskeletal:  General: No swelling.     Cervical back: Neck supple.  Skin:    General: Skin is warm and dry.     Capillary Refill: Capillary refill takes less than 2 seconds.  Neurological:     Mental Status: He is alert.  Psychiatric:        Mood and Affect: Mood normal.      UC Treatments / Results  Labs (all labs ordered are listed, but only abnormal results are displayed) Labs Reviewed - No data to display  EKG   Radiology No results found.  Procedures Procedures (including critical care time)  Medications Ordered in UC Medications - No data to display  Initial Impression / Assessment and Plan / UC Course  I have reviewed the triage vital signs and the nursing notes.  Pertinent labs & imaging results that were available during my care of the patient were reviewed by me and considered in my medical decision making (see chart for details).     Internal hemorrhoids  Rectal pain   Symptoms are most consistent with recurrent internal hemorrhoids and need to schedule your colonoscopy as recommended by the gastroenterologist.  We can try to improve the symptoms with the following: Hydrocortisone cream rectally twice daily for hemorrhoids Increase fiber intake whether using fiber supplement pills, mixtures or increasing your fiber rich foods Avoid straining with bowel movements and avoid sitting on the toilet for long periods Increase your water intake to help with constipation Call your gastroenterologist next week to schedule your colonoscopy as recommended  Final Clinical Impressions(s) / UC Diagnoses   Final diagnoses:  Internal hemorrhoids  Rectal pain     Discharge Instructions       Symptoms are most consistent with recurrent internal hemorrhoids and need to schedule your colonoscopy as recommended by the gastroenterologist.  We can try to improve the symptoms with the following: Hydrocortisone cream rectally twice daily for hemorrhoids Increase fiber intake whether using fiber supplement pills, mixtures or increasing your fiber rich foods Avoid straining with bowel movements and avoid sitting on the toilet for long periods Increase your water intake to help with constipation Call your gastroenterologist next week to schedule your colonoscopy as recommended     ED Prescriptions     Medication Sig Dispense Auth. Provider   hydrocortisone (ANUSOL-HC) 2.5 % rectal cream Place 1 Application rectally 2 (two) times daily. 30 g Kreg Pesa, [place]      PDMP not reviewed this encounter.   Kreg Pesa, [place] 10/02/23 1658

## 2023-10-02 NOTE — Discharge Instructions (Addendum)
 Symptoms are most consistent with recurrent internal hemorrhoids and need to schedule your colonoscopy as recommended by the gastroenterologist.  We can try to improve the symptoms with the following: Hydrocortisone cream rectally twice daily for hemorrhoids Increase fiber intake whether using fiber supplement pills, mixtures or increasing your fiber rich foods Avoid straining with bowel movements and avoid sitting on the toilet for long periods Increase your water intake to help with constipation Call your gastroenterologist next week to schedule your colonoscopy as recommended

## 2023-10-27 ENCOUNTER — Ambulatory Visit
Admission: EM | Admit: 2023-10-27 | Discharge: 2023-10-27 | Disposition: A | Discharge status: Home / Self Care | Attending: Family Medicine | Admitting: Family Medicine

## 2023-10-27 ENCOUNTER — Encounter: Payer: Self-pay | Admitting: Emergency Medicine

## 2023-10-27 ENCOUNTER — Emergency Department (HOSPITAL_COMMUNITY)
Admission: EM | Admit: 2023-10-27 | Discharge: 2023-10-28 | Disposition: A | Discharge status: Home / Self Care | Attending: Emergency Medicine | Admitting: Emergency Medicine

## 2023-10-27 DIAGNOSIS — Z79899 Other long term (current) drug therapy: Secondary | ICD-10-CM | POA: Insufficient documentation

## 2023-10-27 DIAGNOSIS — Z7982 Long term (current) use of aspirin: Secondary | ICD-10-CM | POA: Diagnosis not present

## 2023-10-27 DIAGNOSIS — J45909 Unspecified asthma, uncomplicated: Secondary | ICD-10-CM | POA: Diagnosis not present

## 2023-10-27 DIAGNOSIS — K5792 Diverticulitis of intestine, part unspecified, without perforation or abscess without bleeding: Secondary | ICD-10-CM

## 2023-10-27 DIAGNOSIS — K5730 Diverticulosis of large intestine without perforation or abscess without bleeding: Secondary | ICD-10-CM | POA: Insufficient documentation

## 2023-10-27 DIAGNOSIS — R1030 Lower abdominal pain, unspecified: Secondary | ICD-10-CM | POA: Diagnosis present

## 2023-10-27 DIAGNOSIS — I10 Essential (primary) hypertension: Secondary | ICD-10-CM | POA: Diagnosis not present

## 2023-10-27 LAB — CBC
HCT: 44.3 % (ref 39.0–52.0)
Hemoglobin: 15 g/dL (ref 13.0–17.0)
MCH: 32.9 pg (ref 26.0–34.0)
MCHC: 33.9 g/dL (ref 30.0–36.0)
MCV: 97.1 fL (ref 80.0–100.0)
Platelets: 198 10*3/uL (ref 150–400)
RBC: 4.56 MIL/uL (ref 4.22–5.81)
RDW: 12.9 % (ref 11.5–15.5)
WBC: 6.4 10*3/uL (ref 4.0–10.5)
nRBC: 0 % (ref 0.0–0.2)

## 2023-10-27 LAB — POCT URINALYSIS DIP (MANUAL ENTRY)
Bilirubin, UA: NEGATIVE
Glucose, UA: NEGATIVE mg/dL
Ketones, POC UA: NEGATIVE mg/dL
Leukocytes, UA: NEGATIVE
Nitrite, UA: NEGATIVE
Protein Ur, POC: NEGATIVE mg/dL
Spec Grav, UA: 1.025
Urobilinogen, UA: 0.2 U/dL
pH, UA: 6

## 2023-10-27 NOTE — ED Triage Notes (Signed)
 Patient arrived with lower abdominal pain over the last two days. Declines NVD. Seen at [facility] for same and told to come here for further eval.

## 2023-10-27 NOTE — ED Provider Triage Note (Signed)
 Emergency Medicine Provider Triage Evaluation Note  CUAUHTEMOC DEBERARDINIS , a 58 y.o. male  was evaluated in triage.  Pt complains of Llq abd pain 2 days without NVD fever.  Review of Systems  Positive: Abd pain Negative: Fever  Physical Exam  BP (!) 160/92 (BP Location: Right Arm)   Pulse 90   Temp 98.3 F (36.8 C)   Resp 20   SpO2 97%  Gen:   Awake, no distress   Resp:  Normal effort  MSK:   Moves extremities without difficulty  Other:  TTP to LLQ  Medical Decision Making  Medically screening exam initiated at 11:49 PM.  Appropriate orders placed.  DECARLO MATSEN was informed that the remainder of the evaluation will be completed by another provider, this initial triage assessment does not replace that evaluation, and the importance of remaining in the ED until their evaluation is complete.     Eudora Heron, PA-C 10/27/23 2350

## 2023-10-27 NOTE — ED Triage Notes (Signed)
 Pt c/o abdominal pain. Pt says if he bends down, coughs, or he is sitting down and goes to stand up then he feels the pain. Symptoms onset about 3 days ago. Pt denies emesis ad dizziness.

## 2023-10-28 ENCOUNTER — Emergency Department (HOSPITAL_COMMUNITY)

## 2023-10-28 LAB — COMPREHENSIVE METABOLIC PANEL WITH GFR
ALT: 27 U/L (ref 0–44)
AST: 25 U/L (ref 15–41)
Albumin: 3.9 g/dL (ref 3.5–5.0)
Alkaline Phosphatase: 81 U/L (ref 38–126)
Anion gap: 6 (ref 5–15)
BUN: 15 mg/dL (ref 6–20)
CO2: 24 mmol/L (ref 22–32)
Calcium: 8.7 mg/dL — ABNORMAL LOW (ref 8.9–10.3)
Chloride: 105 mmol/L (ref 98–111)
Creatinine, Ser: 0.95 mg/dL (ref 0.61–1.24)
GFR, Estimated: >60 mL/min (ref >60)
Glucose, Bld: 112 mg/dL — ABNORMAL HIGH (ref 70–99)
Potassium: 3.7 mmol/L (ref 3.5–5.1)
Sodium: 135 mmol/L (ref 135–145)
Total Bilirubin: 0.6 mg/dL (ref 0.0–1.2)
Total Protein: 7.1 g/dL (ref 6.5–8.1)

## 2023-10-28 LAB — URINALYSIS, ROUTINE W REFLEX MICROSCOPIC
Bilirubin Urine: NEGATIVE
Glucose, UA: NEGATIVE mg/dL
Hgb urine dipstick: NEGATIVE
Ketones, ur: NEGATIVE mg/dL
Leukocytes,Ua: NEGATIVE
Nitrite: NEGATIVE
Protein, ur: NEGATIVE mg/dL
Specific Gravity, Urine: >1.046 — ABNORMAL HIGH (ref 1.005–1.030)
pH: 6 (ref 5.0–8.0)

## 2023-10-28 LAB — LIPASE, BLOOD: Lipase: 35 U/L (ref 11–51)

## 2023-10-28 MED ORDER — IOHEXOL 300 MG/ML  SOLN
100.0000 mL | Freq: Once | INTRAMUSCULAR | Status: AC | PRN
  Administered 2023-10-28: 100 mL via INTRAVENOUS

## 2023-10-28 MED ORDER — HYDROCODONE-ACETAMINOPHEN 5-325 MG PO TABS: 1.0000 | ORAL_TABLET | ORAL | 0 refills | Status: DC | PRN

## 2023-10-28 MED ORDER — AMOXICILLIN-POT CLAVULANATE 875-125 MG PO TABS: 1.0000 | ORAL_TABLET | Freq: Two times a day (BID) | ORAL | 0 refills | Status: DC

## 2023-10-28 NOTE — Discharge Instructions (Addendum)
 You were diagnosed tonight with diverticulitis.  I prescribed a course of antibiotics.  Please take until complete.  I also prescribed a short course of pain medication for breakthrough pain.  Please follow-up with your primary care provider.  If you develop any life-threatening symptoms return to the emergency department.

## 2023-10-28 NOTE — ED Provider Notes (Signed)
 [place] EMERGENCY DEPARTMENT AT [facility] Provider Note   CSN: [id number] Arrival date & time: 10/27/23  2311     History  Chief Complaint  Patient presents with   Abdominal Pain    Scott Vance is a 58 y.o. male.  Patient presents to the emergency room complaining of lower abdominal pain over the past 2 days.  He denies nausea, vomiting, diarrhea, shortness of breath, fever, blood in stool, other symptoms at this time.  Hematoma urgent care who felt that he may need imaging for further evaluation.  Past medical history sniffer asthma, hypertension   Abdominal Pain      Home Medications Prior to Admission medications   Medication Sig Start Date End Date Taking? Authorizing Provider  amoxicillin -clavulanate (AUGMENTIN ) 875-125 MG tablet Take 1 tablet by mouth every 12 (twelve) hours. 10/28/23  Yes Elisa Guest, PA-C  HYDROcodone -acetaminophen  (NORCO/VICODIN) 5-325 MG tablet Take 1 tablet by mouth every 4 (four) hours as needed for severe pain (pain score 7-10). 10/28/23  Yes Kyrra Prada, Antone Batten, PA-C  amLODipine  (NORVASC ) 5 MG tablet Take 1 tablet (5 mg total) by mouth daily. 07/19/22   Rancour, Mara [place], MD  amLODipine -valsartan (EXFORGE) 10-320 MG tablet Take 1 tablet by mouth daily.    [provider]  Ascorbic Acid (VITAMIN C) 1000 MG tablet Take 1,000 mg by mouth daily.    [provider]  aspirin EC 81 MG tablet Take 81 mg by mouth daily as needed (blood circulation). Swallow whole.    [provider]  cholecalciferol (VITAMIN D3) 25 MCG (1000 UNIT) tablet Take by mouth. 08/17/23   [provider]  CRESTOR 20 MG tablet Take 20 mg by mouth daily. 12/06/22   [provider]  DRISDOL 1.25 MG (50000 UT) capsule Take 50,000 Units by mouth once a week. 12/31/22   [provider]  hydrocortisone  (ANUSOL -HC) 2.5 % rectal cream Place 1 Application rectally 2 (two) times daily. 10/02/23   White, Elizabeth A, PA-C   losartan-hydrochlorothiazide (HYZAAR) 50-12.5 MG tablet Take 1 tablet by mouth daily. 07/12/23   [provider]  valACYclovir  (VALTREX ) 1000 MG tablet Take 1 tablet (1,000 mg total) by mouth 3 (three) times daily. 03/04/23   Vernestine Gondola, PA-C  dicyclomine  (BENTYL ) 20 MG tablet Take 1 tablet (20 mg total) by mouth 2 (two) times daily. Patient not taking: Reported on 12/14/2018 10/15/17 03/20/19  Harris, Abigail, PA-C  famotidine  (PEPCID ) 20 MG tablet Take 1 tablet (20 mg total) by mouth 2 (two) times daily. Patient not taking: Reported on 12/14/2018 10/15/17 03/20/19  Harris, Abigail, PA-C  simethicone  [facility]) 80 MG chewable tablet Chew 80 mg by mouth every 6 (six) hours as needed for flatulence.  03/20/19  [provider]  sucralfate  (CARAFATE ) 1 g tablet Take 1 tablet (1 g total) by mouth 3 (three) times daily. Patient not taking: Reported on 12/14/2018 10/15/17 03/20/19  Harris, Abigail, PA-C      Allergies    Patient has no known allergies.    Review of Systems   Review of Systems  Gastrointestinal:  Positive for abdominal pain.    Physical Exam Updated Vital Signs BP (!) 147/85   Pulse 83   Temp 98 F (36.7 C) (Oral)   Resp 19   SpO2 97%  Physical Exam Vitals and nursing note reviewed.  Constitutional:      General: He is not in acute distress.    Appearance: He is well-developed.  HENT:  Head: Normocephalic and atraumatic.  Eyes:     Conjunctiva/sclera: Conjunctivae normal.  Cardiovascular:     Rate and Rhythm: Normal rate and regular rhythm.  Pulmonary:     Effort: Pulmonary effort is normal. No respiratory distress.     Breath sounds: Normal breath sounds.  Abdominal:     Palpations: Abdomen is soft.     Tenderness: There is abdominal tenderness in the left lower quadrant.  Musculoskeletal:        General: No swelling.     Cervical back: Neck supple.  Skin:    General: Skin is warm and dry.     Capillary Refill: Capillary refill takes less  than 2 seconds.  Neurological:     Mental Status: He is alert.  Psychiatric:        Mood and Affect: Mood normal.     ED Results / Procedures / Treatments   Labs (all labs ordered are listed, but only abnormal results are displayed) Labs Reviewed  COMPREHENSIVE METABOLIC PANEL WITH GFR - Abnormal; Notable for the following components:      Result Value   Glucose, Bld 112 (*)    Calcium 8.7 (*)    All other components within normal limits  URINALYSIS, ROUTINE W REFLEX MICROSCOPIC - Abnormal; Notable for the following components:   Color, Urine STRAW (*)    Specific Gravity, Urine >1.046 (*)    All other components within normal limits  LIPASE, BLOOD  CBC    EKG None  Radiology CT ABDOMEN PELVIS W CONTRAST Result Date: 10/28/2023 CLINICAL DATA:  Left lower quadrant pain EXAM: CT ABDOMEN AND PELVIS WITH CONTRAST TECHNIQUE: Multidetector CT imaging of the abdomen and pelvis was performed using the standard protocol following bolus administration of intravenous contrast. RADIATION DOSE REDUCTION: This exam was performed according to the departmental dose-optimization program which includes automated exposure control, adjustment of the mA and/or kV according to patient size and/or use of iterative reconstruction technique. CONTRAST:  OMNIPAQUE  IOHEXOL  300 MG/ML  SOLN COMPARISON:  03/13/2022 FINDINGS: Lower chest: No acute abnormality Hepatobiliary: No focal hepatic abnormality. Gallbladder unremarkable. Pancreas: No focal abnormality or ductal dilatation. Spleen: No focal abnormality.  Normal size. Adrenals/Urinary Tract: No adrenal abnormality. No focal renal abnormality. No stones or hydronephrosis. Urinary bladder is unremarkable. Stomach/Bowel: Stomach and small bowel decompressed. Appendix normal. Sigmoid diverticulosis. Inflammatory stranding around the mid sigmoid colon compatible with active diverticulitis. Vascular/Lymphatic: No evidence of aneurysm or adenopathy. Reproductive:  No visible focal abnormality. Other: No free fluid or free air. Musculoskeletal: No acute bony abnormality. IMPRESSION: Sigmoid diverticulosis with inflammatory stranding around the mid sigmoid colon compatible with active diverticulitis. Electronically Signed   By: Janeece Mechanic M.D.   On: 10/28/2023 00:50    Procedures Procedures    Medications Ordered in ED Medications  iohexol  (OMNIPAQUE ) 300 MG/ML solution 100 mL (100 mLs Intravenous Contrast Given 10/28/23 0037)    ED Course/ Medical Decision Making/ A&P                                 Medical Decision Making Amount and/or Complexity of Data Reviewed Labs: ordered.   This patient presents to the ED for concern of abdominal pain, this involves an extensive number of treatment options, and is a complaint that carries with it a high risk of complications and morbidity.  The differential diagnosis includes diverticulitis, cholecystitis, appendicitis, colitis, others   Co morbidities that complicate the  patient evaluation  Hypertension   Additional history obtained:   External records from outside source obtained and reviewed including urgent care note   Lab Tests:  I Ordered, and personally interpreted labs.  The pertinent results include: UA with elevated specific gravity   Imaging Studies ordered:  I ordered imaging studies including CT abdomen pelvis with contrast I independently visualized and interpreted imaging which showed  Sigmoid diverticulosis with inflammatory stranding around the mid  sigmoid colon compatible with active diverticulitis   I agree with the radiologist interpretation   Social Determinants of Health:  Patient is a daily tobacco smoker   Test / Admission - Considered:  Patient with findings consistent with acute diverticulitis.  Patient's symptoms are relatively mild.  He has been tolerating oral intake with no difficulty.  Pain is manageable and the patient has required no pain  medication while in the emergency department. Plan to prescribe augmentin , make diet recommendations, and discharge. No indication at this time for surgical consultation, further emergent workup, or admission. Return precautions provided. Patient voices understanding with plan         Final Clinical Impression(s) / ED Diagnoses Final diagnoses:  Diverticulitis    Rx / DC Orders ED Discharge Orders          Ordered    amoxicillin -clavulanate (AUGMENTIN ) 875-125 MG tablet  Every 12 hours        10/28/23 0510    HYDROcodone -acetaminophen  (NORCO/VICODIN) 5-325 MG tablet  Every 4 hours PRN        10/28/23 0511              Elisa Guest, PA-C 10/28/23 0512    Alissa April, MD 10/28/23 [phone]

## 2023-11-01 NOTE — ED Provider Notes (Signed)
 [facility]   [id number] 10/27/23 Arrival Time: 1830  ASSESSMENT & PLAN:  1. Lower abdominal pain    Fairly benign abdomen but given persistent lower pain, recommend ED evaluation. To ED via POV. Stable upon discharge.  Results for orders placed or performed during the hospital encounter of 10/27/23  POCT urinalysis dipstick   Collection Time: 10/27/23  7:47 PM  Result Value Ref Range   Color, UA yellow    Clarity, UA clear    Glucose, UA negative mg/dL   Bilirubin, UA negative    Ketones, POC UA negative mg/dL   Spec Grav, UA 4.098    Blood, UA trace-intact (A)    pH, UA 6.0    Protein Ur, POC negative mg/dL   Urobilinogen, UA 0.2 E.U./dL   Nitrite, UA Negative    Leukocytes, UA Negative      Reviewed expectations re: course of current medical issues. Questions answered. Outlined signs and symptoms indicating need for more acute intervention. Patient verbalized understanding. After Visit Summary given.   SUBJECTIVE: History from: patient. Scott Vance is a 58 y.o. male who presents with complaint of persistent mild lower abd pain; worse with movements; does not wake him at night. Denies bowel/bladder habit changes. Denies fever. Denies n/v. Pain does not change with PO intake. No tx PTA,  OBJECTIVE:  Vitals:   10/27/23 1911 10/27/23 1913  BP:  (!) 146/92  Pulse:  88  Resp:  18  Temp:  99.8 F (37.7 C)  TempSrc:  Oral  SpO2:  98%  Weight: 104.3 kg   Height: 5\' 9"  (1.753 m)     General appearance: alert, oriented, no acute distress Lungs: without labored respirations; CTAB Heart: regular Abdomen: soft, vague lower abdominal discomfort reported with palpation; bowel sounds normal; no masses or organomegaly; no guarding or rebound tenderness Extremities: lower: without edema; without calf swelling or tenderness; symmetrical without gross deformities Skin: warm and dry; without rash or lesions Psychological: alert and cooperative; normal mood and  affect  Labs: Results for orders placed or performed during the hospital encounter of 10/27/23  POCT urinalysis dipstick   Collection Time: 10/27/23  7:47 PM  Result Value Ref Range   Color, UA yellow    Clarity, UA clear    Glucose, UA negative mg/dL   Bilirubin, UA negative    Ketones, POC UA negative mg/dL   Spec Grav, UA 1.191    Blood, UA trace-intact (A)    pH, UA 6.0    Protein Ur, POC negative mg/dL   Urobilinogen, UA 0.2 E.U./dL   Nitrite, UA Negative    Leukocytes, UA Negative    Labs Reviewed  POCT URINALYSIS DIP (MANUAL ENTRY) - Abnormal; Notable for the following components:      Result Value   Blood, UA trace-intact (*)    All other components within normal limits    Imaging: No results found.   No Known Allergies  Past Medical History:  Diagnosis Date   Acid reflux    Asthma    Asthma 09/22/2022   Hypertension 09/22/2022   Social History   Socioeconomic History   Marital status: Single    Spouse name: Not on file   Number of children: Not on file   Years of education: Not on file   Highest education level: Not on file  Occupational History   Not on file  Tobacco Use   Smoking status: Some Days    Types: Cigarettes   Smokeless tobacco: Never  Tobacco comments:    1/2 pack a day   Vaping Use   Vaping status: Never Used  Substance and Sexual Activity   Alcohol use: No   Drug use: No   Sexual activity: Not Currently  Other Topics Concern   Not on file  Social History Narrative   Not on file   Social Drivers of Health   Financial Resource Strain: Not on file  Food Insecurity: Not on file  Transportation Needs: Not on file  Physical Activity: Not on file  Stress: Not on file  Social Connections: Not on file  Intimate Partner Violence: Not on file   History reviewed. No pertinent family history. History reviewed. No pertinent surgical history.    Afton Albright, MD 11/01/23 [phone]

## 2023-12-07 ENCOUNTER — Encounter: Payer: Self-pay | Admitting: Emergency Medicine

## 2023-12-07 ENCOUNTER — Ambulatory Visit: Admission: EM | Admit: 2023-12-07 | Discharge: 2023-12-07 | Disposition: A | Discharge status: Home / Self Care

## 2023-12-07 DIAGNOSIS — R202 Paresthesia of skin: Secondary | ICD-10-CM | POA: Diagnosis not present

## 2023-12-07 MED ORDER — DEXAMETHASONE SODIUM PHOSPHATE 10 MG/ML IJ SOLN
10.0000 mg | Freq: Once | INTRAMUSCULAR | Status: AC
  Administered 2023-12-07: 10 mg via INTRAMUSCULAR

## 2023-12-07 MED ORDER — GABAPENTIN 100 MG PO CAPS: 100.0000 mg | ORAL_CAPSULE | Freq: Every day | ORAL | 0 refills | Status: AC

## 2023-12-07 NOTE — ED Triage Notes (Signed)
 Pt presents c/o of back pain in mid back more to the right of the spine. Pt says the sxs onset about a week ago. He reports the feeling as burning/stinging sensation like he is being poked by needles. Pt denies injury.

## 2023-12-07 NOTE — ED Provider Notes (Signed)
 [facility]    CSN: [id number] Arrival date & time: 12/07/23  1632      History   Chief Complaint Chief Complaint  Patient presents with   Back Pain    HPI Scott Vance is a 58 y.o. male.   Scott Vance is a 58 y.o. male that presents with a burning and stinging sensation in the middle to lower back that has persisted for approximately one week. The patient describes the sensation as itchy and feeling like pins and needles, particularly exacerbated by sweating. The patient reports using [place] and an anti-itch cream without relief. The patient mentions experiencing a similar but less severe episode a long time ago. The current symptoms are characterized as tingly and itchy, like needles, with worsening when sweating occurs. The patient denies any recent changes in soaps, lotions, or detergents. There is no visible rash associated with the symptoms. The patient reports taking blood pressure medication and vitamin C daily, with no recent changes to medication regimen. The patient denies alcohol consumption. A primary care visit occurred about a month ago, with reportedly normal blood work results. The patient notes that sometimes the sensation stops but then recurs with sweating.  The following portions of the patient's history were reviewed and updated as appropriate: allergies, current medications, past family history, past medical history, past social history, past surgical history, and problem list.    Past Medical History:  Diagnosis Date   Acid reflux    Asthma    Asthma 09/22/2022   Hypertension 09/22/2022    Patient Active Problem List   Diagnosis Date Noted   Encounter for screening colonoscopy 09/14/2023   Tobacco abuse 12/27/2022   Left forearm pain 12/27/2022   Polycythemia, secondary 12/27/2022   Mixed hyperlipidemia 09/22/2022   Leukopenia 09/22/2022   Asthma 09/22/2022   Hypertension 09/22/2022   Severe sleep apnea 09/22/2022     History reviewed. No pertinent surgical history.     Home Medications    Prior to Admission medications   Medication Sig Start Date End Date Taking? Authorizing Provider  amLODipine  (NORVASC ) 10 MG tablet Take 10 mg by mouth daily. 07/20/23  Yes [provider]  gabapentin (NEURONTIN) 100 MG capsule Take 1 capsule (100 mg total) by mouth at bedtime. 12/07/23  Yes Maryruth Sol, FNP  amLODipine  (NORVASC ) 5 MG tablet Take 1 tablet (5 mg total) by mouth daily. 07/19/22   Rancour, Mara [place], MD  amLODipine -valsartan (EXFORGE) 10-320 MG tablet Take 1 tablet by mouth daily.    [provider]  amoxicillin -clavulanate (AUGMENTIN ) 875-125 MG tablet Take 1 tablet by mouth every 12 (twelve) hours. 10/28/23   Elisa Guest, PA-C  Ascorbic Acid (VITAMIN C) 1000 MG tablet Take 1,000 mg by mouth daily.    [provider]  aspirin EC 81 MG tablet Take 81 mg by mouth daily as needed (blood circulation). Swallow whole.    [provider]  cholecalciferol (VITAMIN D3) 25 MCG (1000 UNIT) tablet Take by mouth. 08/17/23   [provider]  CRESTOR 20 MG tablet Take 20 mg by mouth daily. 12/06/22   [provider]  DRISDOL 1.25 MG (50000 UT) capsule Take 50,000 Units by mouth once a week. 12/31/22   [provider]  HYDROcodone -acetaminophen  (NORCO/VICODIN) 5-325 MG tablet Take 1 tablet by mouth every 4 (four) hours as needed for severe pain (pain score 7-10). 10/28/23   Elisa Guest, PA-C  hydrocortisone  (ANUSOL -HC) 2.5 % rectal cream Place 1 Application rectally  2 (two) times daily. 10/02/23   White, Elizabeth A, PA-C  losartan-hydrochlorothiazide (HYZAAR) 50-12.5 MG tablet Take 1 tablet by mouth daily. 07/12/23   [provider]  valACYclovir  (VALTREX ) 1000 MG tablet Take 1 tablet (1,000 mg total) by mouth 3 (three) times daily. 03/04/23   Vernestine Gondola, PA-C  dicyclomine  (BENTYL ) 20 MG tablet Take 1 tablet (20 mg total) by mouth 2  (two) times daily. Patient not taking: Reported on 12/14/2018 10/15/17 03/20/19  Harris, Abigail, PA-C  famotidine  (PEPCID ) 20 MG tablet Take 1 tablet (20 mg total) by mouth 2 (two) times daily. Patient not taking: Reported on 12/14/2018 10/15/17 03/20/19  Harris, Abigail, PA-C  simethicone  [facility]) 80 MG chewable tablet Chew 80 mg by mouth every 6 (six) hours as needed for flatulence.  03/20/19  [provider]  sucralfate  (CARAFATE ) 1 g tablet Take 1 tablet (1 g total) by mouth 3 (three) times daily. Patient not taking: Reported on 12/14/2018 10/15/17 03/20/19  Tama Fails, PA-C    Family History History reviewed. No pertinent family history.  Social History Social History   Tobacco Use   Smoking status: Some Days    Types: Cigarettes    Passive exposure: Current   Smokeless tobacco: Never   Tobacco comments:    1/2 pack a day   Vaping Use   Vaping status: Never Used  Substance Use Topics   Alcohol use: No   Drug use: No     Allergies   Patient has no known allergies.   Review of Systems Review of Systems  Musculoskeletal:  Negative for arthralgias, back pain, gait problem and joint swelling.  Skin:  Negative for rash.  Neurological:  Negative for numbness.       Itchy and tingling sensation throughout the back  All other systems reviewed and are negative.    Physical Exam Triage Vital Signs ED Triage Vitals  Encounter Vitals Group     BP 12/07/23 1722 139/82     Girls Systolic BP Percentile --      Girls Diastolic BP Percentile --      Boys Systolic BP Percentile --      Boys Diastolic BP Percentile --      Pulse Rate 12/07/23 1722 91     Resp 12/07/23 1722 18     Temp 12/07/23 1722 98.1 F (36.7 C)     Temp Source 12/07/23 1722 Oral     SpO2 12/07/23 1722 97 %     Weight 12/07/23 1720 229 lb 15 oz (104.3 kg)     Height --      Head Circumference --      Peak Flow --      Pain Score 12/07/23 1719 9     Pain Loc --      Pain Education --       Exclude from Growth Chart --    No data found.  Updated Vital Signs BP 139/82 (BP Location: Left Arm)   Pulse 91   Temp 98.1 F (36.7 C) (Oral)   Resp 18   Wt 229 lb 15 oz (104.3 kg)   SpO2 97%   BMI 33.96 kg/m   Visual Acuity Right Eye Distance:   Left Eye Distance:   Bilateral Distance:    Right Eye Near:   Left Eye Near:    Bilateral Near:     Physical Exam Vitals reviewed.  Constitutional:      General: He is not in acute distress.  Appearance: Normal appearance. He is not toxic-appearing.  HENT:     Head: Normocephalic.     Mouth/Throat:     Mouth: Mucous membranes are moist.   Eyes:     Conjunctiva/sclera: Conjunctivae normal.    Cardiovascular:     Rate and Rhythm: Normal rate and regular rhythm.     Heart sounds: Normal heart sounds.  Pulmonary:     Effort: Pulmonary effort is normal.     Breath sounds: Normal breath sounds.   Musculoskeletal:        General: Normal range of motion.   Skin:    General: Skin is warm and dry.     Findings: No lesion or rash.   Neurological:     General: No focal deficit present.     Mental Status: He is alert and oriented to person, place, and time.      UC Treatments / Results  Labs (all labs ordered are listed, but only abnormal results are displayed) Labs Reviewed  TSH  T4, FREE  VITAMIN B12  HEMOGLOBIN A1C    EKG   Radiology No results found.  Procedures Procedures (including critical care time)  Medications Ordered in UC Medications  dexamethasone  (DECADRON ) injection 10 mg (has no administration in time range)    Initial Impression / Assessment and Plan / UC Course  I have reviewed the triage vital signs and the nursing notes.  Pertinent labs & imaging results that were available during my care of the patient were reviewed by me and considered in my medical decision making (see chart for details).     The patient presents with a one-week history of burning, stinging, and itching  sensations localized to the mid to lower back, described as pins and needles and worsened by sweating. There is no visible rash or skin abnormality on examination. The differential includes neuropathic irritation, nutritional deficiencies, contact reaction, or underlying metabolic causes. The patient denies any recent exposure to new topical products. Prior labs from one month ago were reportedly normal but did not include thyroid function or vitamin B12. A Decadron  injection was administered in clinic, and gabapentin 100 mg was prescribed at bedtime for symptom relief. Additional blood work was ordered to evaluate thyroid function, A1c, and vitamin B12 levels. The patient was advised to follow up with their primary care provider within 7 to 10 days for reassessment and review of lab results. They should seek emergency care if symptoms rapidly worsen, spread significantly, or are accompanied by new weakness, numbness, or systemic symptoms.  Today's evaluation has revealed no signs of a dangerous process. Discussed diagnosis with patient and/or guardian. Patient and/or guardian aware of their diagnosis, possible red flag symptoms to watch out for and need for close follow up. Patient and/or guardian understands verbal and written discharge instructions. Patient and/or guardian comfortable with plan and disposition.  Patient and/or guardian has a clear mental status at this time, good insight into illness (after discussion and teaching) and has clear judgment to make decisions regarding their care  Documentation was completed with the aid of voice recognition software. Transcription may contain typographical errors. Final Clinical Impressions(s) / UC Diagnoses   Final diagnoses:  Paresthesia of skin     Discharge Instructions      You were seen today for a burning, stinging, and itching sensation in your mid to lower back that has lasted for about a week. No rash or visible skin changes were noted on  exam. A Decadron  injection was given  to reduce inflammation, and gabapentin was prescribed to take at bedtime to help relieve nerve-related discomfort. Blood work was ordered to check your thyroid, vitamin B12 levels, and blood sugar. To manage symptoms at home, avoid overheating and try to stay cool to reduce irritation from sweating. Wear loose, breathable clothing and avoid scratching the area. You may use a cool compress for temporary relief. Follow up with your primary care provider in 7 to 10 days to review your lab results and evaluate your symptoms. Seek medical attention sooner if you experience new or worsening symptoms such as numbness, muscle weakness, spreading pain, fever, or rash. Go to the emergency department if symptoms become severe or are accompanied by difficulty walking, loss of bladder or bowel control, or other neurologic concerns.      ED Prescriptions     Medication Sig Dispense Auth. Provider   gabapentin (NEURONTIN) 100 MG capsule Take 1 capsule (100 mg total) by mouth at bedtime. 14 capsule Maryruth Sol, FNP      PDMP not reviewed this encounter.   Maryruth Sol, [place] 12/07/23 1757

## 2023-12-07 NOTE — Discharge Instructions (Addendum)
 You were seen today for a burning, stinging, and itching sensation in your mid to lower back that has lasted for about a week. No rash or visible skin changes were noted on exam. A Decadron  injection was given to reduce inflammation, and gabapentin was prescribed to take at bedtime to help relieve nerve-related discomfort. Blood work was ordered to check your thyroid, vitamin B12 levels, and blood sugar. To manage symptoms at home, avoid overheating and try to stay cool to reduce irritation from sweating. Wear loose, breathable clothing and avoid scratching the area. You may use a cool compress for temporary relief. Follow up with your primary care provider in 7 to 10 days to review your lab results and evaluate your symptoms. Seek medical attention sooner if you experience new or worsening symptoms such as numbness, muscle weakness, spreading pain, fever, or rash. Go to the emergency department if symptoms become severe or are accompanied by difficulty walking, loss of bladder or bowel control, or other neurologic concerns.

## 2023-12-08 ENCOUNTER — Ambulatory Visit (HOSPITAL_COMMUNITY): Payer: Self-pay

## 2023-12-08 LAB — VITAMIN B12: Vitamin B-12: 504 pg/mL (ref 232–1245)

## 2023-12-08 LAB — HEMOGLOBIN A1C
Est. average glucose Bld gHb Est-mCnc: 123 mg/dL
Hgb A1c MFr Bld: 5.9 % — ABNORMAL HIGH (ref 4.8–5.6)

## 2023-12-08 LAB — TSH: TSH: 3.43 u[IU]/mL (ref 0.450–4.500)

## 2023-12-08 LAB — T4, FREE: Free T4: 0.93 ng/dL (ref 0.82–1.77)

## 2023-12-19 NOTE — Telephone Encounter (Signed)
 Incoming call/information:  phone message... mrn [phone] R. [place] was seen 6/18 and the notes says to follow up with primary care to discuss the labs. However, they said they don't have that information. Whenever able, can someone call and speak to their pcp? It is [facility], ask for Christina. [phone]  Outgoing call/information discussed:  Returned call to patient letting him know he will need to have PCP fill out & he will need to sign a release and their office to obtain records and they can forward to HIM for any records. Pt agree's and will contact PCP.  WENDI Dixon CMA

## 2024-03-09 ENCOUNTER — Ambulatory Visit
Admission: EM | Admit: 2024-03-09 | Discharge: 2024-03-09 | Disposition: A | Discharge status: Home / Self Care | Attending: Physician Assistant | Admitting: Physician Assistant

## 2024-03-09 ENCOUNTER — Encounter: Payer: Self-pay | Admitting: Emergency Medicine

## 2024-03-09 DIAGNOSIS — L03312 Cellulitis of back [any part except buttock]: Secondary | ICD-10-CM | POA: Diagnosis not present

## 2024-03-09 DIAGNOSIS — L723 Sebaceous cyst: Secondary | ICD-10-CM

## 2024-03-09 MED ORDER — CEPHALEXIN 500 MG PO CAPS
500.0000 mg | ORAL_CAPSULE | Freq: Four times a day (QID) | ORAL | 0 refills | Status: AC
Start: 2024-03-09 — End: 2024-03-16

## 2024-03-09 NOTE — ED Triage Notes (Signed)
 Pt presents c/o abscess on his back x 7 days. Pt reports it was hard but is now soft, has grown in size, and is painful. Pt also requesting to have a skin tag removed on right shoulder.

## 2024-03-09 NOTE — Discharge Instructions (Signed)
 Return 3 days if needed for recheck Keep clean, covered Wash with warm soapy water Do not submerge in water - no swimming pools, hot tubs, lakes, rivers, oceans, etc.

## 2024-03-09 NOTE — ED Provider Notes (Signed)
 [facility]    CSN: [id number] Arrival date & time: 03/09/24  1101      History   Chief Complaint Chief Complaint  Patient presents with   Abscess    HPI Scott Vance is a 58 y.o. male.   Patient here for evaluation of abscess to R shoulder x 5 days.  Admits swelling, erythema, purulent discharge.      Past Medical History:  Diagnosis Date   Acid reflux    Asthma    Asthma 09/22/2022   Hypertension 09/22/2022    Patient Active Problem List   Diagnosis Date Noted   Encounter for screening colonoscopy 09/14/2023   Tobacco abuse 12/27/2022   Left forearm pain 12/27/2022   Polycythemia, secondary 12/27/2022   Mixed hyperlipidemia 09/22/2022   Leukopenia 09/22/2022   Asthma 09/22/2022   Hypertension 09/22/2022   Severe sleep apnea 09/22/2022    History reviewed. No pertinent surgical history.     Home Medications    Prior to Admission medications   Medication Sig Start Date End Date Taking? Authorizing Provider  amLODipine  (NORVASC ) 10 MG tablet Take 10 mg by mouth daily. 07/20/23  Yes [provider]  Ascorbic Acid (VITAMIN C) 1000 MG tablet Take 1,000 mg by mouth daily.   Yes [provider]  cephALEXin  (KEFLEX ) 500 MG capsule Take 1 capsule (500 mg total) by mouth 4 (four) times daily for 7 days. 03/09/24 03/16/24 Yes Juleen Rush, PA-C  amLODipine  (NORVASC ) 5 MG tablet Take 1 tablet (5 mg total) by mouth daily. 07/19/22   Rancour, Garnette, MD  amLODipine -valsartan (EXFORGE) 10-320 MG tablet Take 1 tablet by mouth daily.    [provider]  amoxicillin -clavulanate (AUGMENTIN ) 875-125 MG tablet Take 1 tablet by mouth every 12 (twelve) hours. 10/28/23   Logan Ubaldo NOVAK, PA-C  aspirin EC 81 MG tablet Take 81 mg by mouth daily as needed (blood circulation). Swallow whole.    [provider]  cholecalciferol (VITAMIN D3) 25 MCG (1000 UNIT) tablet Take by mouth. 08/17/23   [provider]  CRESTOR 20 MG  tablet Take 20 mg by mouth daily. 12/06/22   [provider]  DRISDOL 1.25 MG (50000 UT) capsule Take 50,000 Units by mouth once a week. 12/31/22   [provider]  gabapentin  (NEURONTIN ) 100 MG capsule Take 1 capsule (100 mg total) by mouth at bedtime. 12/07/23   Murrill, Samantha, FNP  HYDROcodone -acetaminophen  (NORCO/VICODIN) 5-325 MG tablet Take 1 tablet by mouth every 4 (four) hours as needed for severe pain (pain score 7-10). 10/28/23   Logan Ubaldo NOVAK, PA-C  hydrocortisone  (ANUSOL -HC) 2.5 % rectal cream Place 1 Application rectally 2 (two) times daily. 10/02/23   White, Elizabeth A, PA-C  losartan-hydrochlorothiazide (HYZAAR) 50-12.5 MG tablet Take 1 tablet by mouth daily. 07/12/23   [provider]  valACYclovir  (VALTREX ) 1000 MG tablet Take 1 tablet (1,000 mg total) by mouth 3 (three) times daily. 03/04/23   Billy Asberry FALCON, PA-C  dicyclomine  (BENTYL ) 20 MG tablet Take 1 tablet (20 mg total) by mouth 2 (two) times daily. Patient not taking: Reported on 12/14/2018 10/15/17 03/20/19  Harris, Abigail, PA-C  famotidine  (PEPCID ) 20 MG tablet Take 1 tablet (20 mg total) by mouth 2 (two) times daily. Patient not taking: Reported on 12/14/2018 10/15/17 03/20/19  Harris, Abigail, PA-C  simethicone  (MYLICON) 80 MG chewable tablet Chew 80 mg by mouth every 6 (six) hours as needed for flatulence.  03/20/19  [provider]  sucralfate  (CARAFATE ) 1 g  tablet Take 1 tablet (1 g total) by mouth 3 (three) times daily. Patient not taking: Reported on 12/14/2018 10/15/17 03/20/19  Arloa Chroman, PA-C    Family History History reviewed. No pertinent family history.  Social History Social History   Tobacco Use   Smoking status: Some Days    Types: Cigarettes    Passive exposure: Current   Smokeless tobacco: Never   Tobacco comments:    1/2 pack a day   Vaping Use   Vaping status: Never Used  Substance Use Topics   Alcohol use: No   Drug use: No     Allergies   Patient  has no known allergies.   Review of Systems Review of Systems  Constitutional:  Negative for chills, fatigue and fever.  Gastrointestinal:  Negative for nausea and vomiting.  Musculoskeletal:  Positive for myalgias. Negative for arthralgias.  Skin:  Positive for color change and wound.  Neurological:  Negative for weakness and numbness.  Hematological:  Negative for adenopathy. Does not bruise/bleed easily.  Psychiatric/Behavioral:  Negative for sleep disturbance.      Physical Exam Triage Vital Signs ED Triage Vitals  Encounter Vitals Group     BP 03/09/24 1144 131/80     Girls Systolic BP Percentile --      Girls Diastolic BP Percentile --      Boys Systolic BP Percentile --      Boys Diastolic BP Percentile --      Pulse Rate 03/09/24 1144 86     Resp 03/09/24 1144 18     Temp 03/09/24 1144 97.9 F (36.6 C)     Temp Source 03/09/24 1144 Oral     SpO2 03/09/24 1144 98 %     Weight 03/09/24 1143 229 lb 15 oz (104.3 kg)     Height --      Head Circumference --      Peak Flow --      Pain Score 03/09/24 1142 8     Pain Loc --      Pain Education --      Exclude from Growth Chart --    No data found.  Updated Vital Signs BP 131/80 (BP Location: Right Arm)   Pulse 86   Temp 97.9 F (36.6 C) (Oral)   Resp 18   Wt 229 lb 15 oz (104.3 kg)   SpO2 98%   BMI 33.96 kg/m   Visual Acuity Right Eye Distance:   Left Eye Distance:   Bilateral Distance:    Right Eye Near:   Left Eye Near:    Bilateral Near:     Physical Exam Vitals and nursing note reviewed.  Constitutional:      General: He is not in acute distress.    Appearance: Normal appearance. He is not ill-appearing.  HENT:     Head: Normocephalic and atraumatic.  Eyes:     General: No scleral icterus.    Extraocular Movements: Extraocular movements intact.     Conjunctiva/sclera: Conjunctivae normal.  Pulmonary:     Effort: Pulmonary effort is normal. No respiratory distress.  Musculoskeletal:         General: Normal range of motion.     Cervical back: Normal range of motion. No rigidity.  Skin:    General: Skin is warm.     Coloration: Skin is not jaundiced.     Findings: No rash.     Comments: 1.5 cm nodule, raised 1 cm, with 4 cm halo surrounding erythema and  expressible purulent and thick, keratin discharge R shoulder  Neurological:     General: No focal deficit present.     Mental Status: He is alert and oriented to person, place, and time.     Motor: No weakness.     Gait: Gait normal.  Psychiatric:        Mood and Affect: Mood normal.        Behavior: Behavior normal.      UC Treatments / Results  Labs (all labs ordered are listed, but only abnormal results are displayed) Labs Reviewed - No data to display  EKG   Radiology No results found.  Procedures Procedures (including critical care time)  Medications Ordered in UC Medications - No data to display  Initial Impression / Assessment and Plan / UC Course  I have reviewed the triage vital signs and the nursing notes.  Pertinent labs & imaging results that were available during my care of the patient were reviewed by me and considered in my medical decision making (see chart for details).     Appears to be infected epidermal inclusion cyst Drained and expressed Take medication as prescribed Final Clinical Impressions(s) / UC Diagnoses   Final diagnoses:  Sebaceous cyst  Cellulitis of back except buttock     Discharge Instructions      Return 3 days if needed for recheck Keep clean, covered Wash with warm soapy water Do not submerge in water - no swimming pools, hot tubs, lakes, rivers, oceans, etc.    ED Prescriptions     Medication Sig Dispense Auth. Provider   cephALEXin  (KEFLEX ) 500 MG capsule Take 1 capsule (500 mg total) by mouth 4 (four) times daily for 7 days. 28 capsule Juleen Rush, PA-C      PDMP not reviewed this encounter.   Juleen Rush, PA-C 03/09/24 1234

## 2024-03-14 ENCOUNTER — Ambulatory Visit: Admission: EM | Admit: 2024-03-14 | Discharge: 2024-03-14 | Disposition: A | Discharge status: Home / Self Care

## 2024-03-14 ENCOUNTER — Encounter: Payer: Self-pay | Admitting: Emergency Medicine

## 2024-03-14 DIAGNOSIS — Z5189 Encounter for other specified aftercare: Secondary | ICD-10-CM | POA: Diagnosis not present

## 2024-03-14 DIAGNOSIS — Z872 Personal history of diseases of the skin and subcutaneous tissue: Secondary | ICD-10-CM

## 2024-03-14 NOTE — Discharge Instructions (Addendum)
 You were seen today for a recheck of the infected cyst on your right shoulder. It was drained on 03/09/24, and today it looks like it's healing well. There is no redness, swelling, tenderness, or drainage, which means the infection is improving. Keep the area clean and covered. Wash gently with soap and water once daily, pat dry, and apply a clean bandage. Avoid squeezing or picking at the area while it heals. Do not apply any ointments, lotions, or creams to the wound.  Make sure to continue the antibiotic. You can take it three times a day (every 8 hours) until all capsules are gone, even if the shoulder looks better. Come back for a recheck if you notice new or worsening redness, swelling, pain, warmth, drainage, fever, chills, or if the wound starts to bulge again.

## 2024-03-14 NOTE — ED Provider Notes (Signed)
 [facility]    CSN: [id number] Arrival date & time: 03/14/24  0944      History   Chief Complaint Chief Complaint  Patient presents with   Packing Removal     HPI Scott Vance is a 58 y.o. male.   Discussed the use of AI scribe software for clinical note transcription with the patient, who gave verbal consent to proceed.   Patient presents for a wound check following treatment of an infected sebaceous cyst on the right posterior shoulder. He was initially evaluated on 03/09/24, at which time a nodule with surrounding erythema and purulent, thick keratinous discharge was noted and drained in clinic. Associated cellulitis was also present. At that visit, he was instructed to keep the wound clean and covered, return in 3 days for re-evaluation, and was prescribed Keflex  500 mg four times daily for 7 days. Today, he reports adherence to the antibiotic, though admits difficulty consistently taking all four daily doses. He denies pain at the wound site and has not experienced fever, chills, or body aches.  The following sections of the patient's history were reviewed and updated as appropriate: allergies, current medications, past family history, past medical history, past social history, past surgical history, and problem list.     Past Medical History:  Diagnosis Date   Acid reflux    Asthma    Asthma 09/22/2022   Hypertension 09/22/2022    Patient Active Problem List   Diagnosis Date Noted   Encounter for screening colonoscopy 09/14/2023   Tobacco abuse 12/27/2022   Left forearm pain 12/27/2022   Polycythemia, secondary 12/27/2022   Mixed hyperlipidemia 09/22/2022   Leukopenia 09/22/2022   Asthma 09/22/2022   Hypertension 09/22/2022   Severe sleep apnea 09/22/2022    History reviewed. No pertinent surgical history.     Home Medications    Prior to Admission medications   Medication Sig Start Date End Date Taking? Authorizing Provider  amLODipine   (NORVASC ) 10 MG tablet Take 10 mg by mouth daily. 07/20/23   [provider]  amLODipine  (NORVASC ) 5 MG tablet Take 1 tablet (5 mg total) by mouth daily. 07/19/22   Rancour, Garnette, MD  amLODipine -valsartan (EXFORGE) 10-320 MG tablet Take 1 tablet by mouth daily.    [provider]  amoxicillin -clavulanate (AUGMENTIN ) 875-125 MG tablet Take 1 tablet by mouth every 12 (twelve) hours. 10/28/23   Logan Ubaldo NOVAK, PA-C  Ascorbic Acid (VITAMIN C) 1000 MG tablet Take 1,000 mg by mouth daily.    [provider]  aspirin EC 81 MG tablet Take 81 mg by mouth daily as needed (blood circulation). Swallow whole.    [provider]  cephALEXin  (KEFLEX ) 500 MG capsule Take 1 capsule (500 mg total) by mouth 4 (four) times daily for 7 days. 03/09/24 03/16/24  Juleen Rush, PA-C  cholecalciferol (VITAMIN D3) 25 MCG (1000 UNIT) tablet Take by mouth. 08/17/23   [provider]  CRESTOR 20 MG tablet Take 20 mg by mouth daily. 12/06/22   [provider]  DRISDOL 1.25 MG (50000 UT) capsule Take 50,000 Units by mouth once a week. 12/31/22   [provider]  gabapentin  (NEURONTIN ) 100 MG capsule Take 1 capsule (100 mg total) by mouth at bedtime. 12/07/23   Milessa Hogan, FNP  HYDROcodone -acetaminophen  (NORCO/VICODIN) 5-325 MG tablet Take 1 tablet by mouth every 4 (four) hours as needed for severe pain (pain score 7-10). 10/28/23   Logan Ubaldo NOVAK, PA-C  hydrocortisone  (ANUSOL -HC) 2.5 % rectal cream Place  1 Application rectally 2 (two) times daily. 10/02/23   White, Elizabeth A, PA-C  losartan-hydrochlorothiazide (HYZAAR) 50-12.5 MG tablet Take 1 tablet by mouth daily. 07/12/23   [provider]  valACYclovir  (VALTREX ) 1000 MG tablet Take 1 tablet (1,000 mg total) by mouth 3 (three) times daily. 03/04/23   Billy Asberry FALCON, PA-C  dicyclomine  (BENTYL ) 20 MG tablet Take 1 tablet (20 mg total) by mouth 2 (two) times daily. Patient not taking: Reported on  12/14/2018 10/15/17 03/20/19  Harris, Abigail, PA-C  famotidine  (PEPCID ) 20 MG tablet Take 1 tablet (20 mg total) by mouth 2 (two) times daily. Patient not taking: Reported on 12/14/2018 10/15/17 03/20/19  Harris, Abigail, PA-C  simethicone  (MYLICON) 80 MG chewable tablet Chew 80 mg by mouth every 6 (six) hours as needed for flatulence.  03/20/19  [provider]  sucralfate  (CARAFATE ) 1 g tablet Take 1 tablet (1 g total) by mouth 3 (three) times daily. Patient not taking: Reported on 12/14/2018 10/15/17 03/20/19  Arloa Chroman, PA-C    Family History History reviewed. No pertinent family history.  Social History Social History   Tobacco Use   Smoking status: Some Days    Types: Cigarettes    Passive exposure: Current   Smokeless tobacco: Never   Tobacco comments:    1/2 pack a day   Vaping Use   Vaping status: Never Used  Substance Use Topics   Alcohol use: No   Drug use: No     Allergies   Patient has no known allergies.   Review of Systems Review of Systems  Constitutional:  Negative for fever.  Skin:  Positive for wound.  All other systems reviewed and are negative.    Physical Exam Triage Vital Signs ED Triage Vitals  Encounter Vitals Group     BP 03/14/24 1043 (!) 160/94     Girls Systolic BP Percentile --      Girls Diastolic BP Percentile --      Boys Systolic BP Percentile --      Boys Diastolic BP Percentile --      Pulse Rate 03/14/24 1043 83     Resp 03/14/24 1043 18     Temp 03/14/24 1043 97.9 F (36.6 C)     Temp Source 03/14/24 1043 Oral     SpO2 03/14/24 1043 97 %     Weight 03/14/24 1042 229 lb 15 oz (104.3 kg)     Height --      Head Circumference --      Peak Flow --      Pain Score 03/14/24 1042 0     Pain Loc --      Pain Education --      Exclude from Growth Chart --    No data found.  Updated Vital Signs BP (!) 160/94 (BP Location: Left Arm)   Pulse 83   Temp 97.9 F (36.6 C) (Oral)   Resp 18   Wt 229 lb 15 oz (104.3 kg)    SpO2 97%   BMI 33.96 kg/m   Visual Acuity Right Eye Distance:   Left Eye Distance:   Bilateral Distance:    Right Eye Near:   Left Eye Near:    Bilateral Near:     Physical Exam Vitals reviewed.  Constitutional:      General: He is awake. He is not in acute distress.    Appearance: Normal appearance. He is well-developed. He is not ill-appearing, toxic-appearing or diaphoretic.  HENT:  Head: Normocephalic.     Right Ear: Hearing normal.     Left Ear: Hearing normal.     Nose: Nose normal.     Mouth/Throat:     Mouth: Mucous membranes are moist.  Eyes:     General: Vision grossly intact.     Conjunctiva/sclera: Conjunctivae normal.  Cardiovascular:     Rate and Rhythm: Normal rate and regular rhythm.     Heart sounds: Normal heart sounds.  Pulmonary:     Effort: Pulmonary effort is normal.     Breath sounds: Normal breath sounds and air entry.  Musculoskeletal:        General: Normal range of motion.     Cervical back: Normal range of motion and neck supple.  Skin:    General: Skin is warm and dry.     Findings: Wound present.     Comments: Wound to right posterior shoulder appears to be healing well. No surrounding erythema, swelling, or tenderness noted. No underlying fluctuance or expressible discharge present.  Neurological:     General: No focal deficit present.     Mental Status: He is alert and oriented to person, place, and time.  Psychiatric:        Speech: Speech normal.        Behavior: Behavior is cooperative.      UC Treatments / Results  Labs (all labs ordered are listed, but only abnormal results are displayed) Labs Reviewed - No data to display  EKG   Radiology No results found.  Procedures Procedures (including critical care time)  Medications Ordered in UC Medications - No data to display  Initial Impression / Assessment and Plan / UC Course  I have reviewed the triage vital signs and the nursing notes.  Pertinent labs &  imaging results that were available during my care of the patient were reviewed by me and considered in my medical decision making (see chart for details).     The patient presents for re-evaluation of an infected sebaceous cyst on the right posterior shoulder, previously drained in clinic on 03/09/24 with associated cellulitis. He was prescribed Keflex  but reports some difficulty maintaining four daily doses. On today's exam, the wound is healing well without erythema, swelling, tenderness, fluctuance, or drainage. Findings suggest improvement of the infection with no evidence of ongoing abscess or cellulitis. Plan is to continue wound care with local cleansing and coverage as previously instructed and to complete the course of antibiotics as tolerated. The patient was advised to take the medication 3 times a day (every 8 hours) until all capsules are gone. He should return for reassessment or seek urgent care if new or worsening redness, swelling, pain, drainage, or systemic symptoms develop.  Today's evaluation has revealed no signs of a dangerous process. Discussed diagnosis with patient and/or guardian. Patient and/or guardian aware of their diagnosis, possible red flag symptoms to watch out for and need for close follow up. Patient and/or guardian understands verbal and written discharge instructions. Patient and/or guardian comfortable with plan and disposition.  Patient and/or guardian has a clear mental status at this time, good insight into illness (after discussion and teaching) and has clear judgment to make decisions regarding their care  Documentation was completed with the aid of voice recognition software. Transcription may contain typographical errors.  Final Clinical Impressions(s) / UC Diagnoses   Final diagnoses:  History of sebaceous cyst  Encounter for wound re-check     Discharge Instructions  You were seen today for a recheck of the infected cyst on your right  shoulder. It was drained on 03/09/24, and today it looks like it's healing well. There is no redness, swelling, tenderness, or drainage, which means the infection is improving. Keep the area clean and covered. Wash gently with soap and water once daily, pat dry, and apply a clean bandage. Avoid squeezing or picking at the area while it heals. Do not apply any ointments, lotions, or creams to the wound.  Make sure to continue the antibiotic. You can take it three times a day (every 8 hours) until all capsules are gone, even if the shoulder looks better. Come back for a recheck if you notice new or worsening redness, swelling, pain, warmth, drainage, fever, chills, or if the wound starts to bulge again.     ED Prescriptions   None    PDMP not reviewed this encounter.   Iola Lukes, [place] 03/14/24 1243

## 2024-03-14 NOTE — ED Triage Notes (Signed)
 Pt presents to have packing removed from incision on upper right back. Pt says he has no concerns about area that was drained.

## 2024-03-30 ENCOUNTER — Encounter (HOSPITAL_COMMUNITY): Payer: Self-pay

## 2024-03-30 ENCOUNTER — Ambulatory Visit (HOSPITAL_COMMUNITY): Admission: EM | Admit: 2024-03-30 | Discharge: 2024-03-30 | Disposition: A | Discharge status: Home / Self Care

## 2024-03-30 DIAGNOSIS — N342 Other urethritis: Secondary | ICD-10-CM | POA: Diagnosis present

## 2024-03-30 LAB — POCT URINALYSIS DIP (MANUAL ENTRY)
Bilirubin, UA: NEGATIVE
Glucose, UA: NEGATIVE mg/dL
Ketones, POC UA: NEGATIVE mg/dL
Leukocytes, UA: NEGATIVE
Nitrite, UA: NEGATIVE
Protein Ur, POC: NEGATIVE mg/dL
Spec Grav, UA: 1.02 (ref 1.010–1.025)
Urobilinogen, UA: 0.2 U/dL
pH, UA: 5 (ref 5.0–8.0)

## 2024-03-30 MED ORDER — PHENAZOPYRIDINE HCL 200 MG PO TABS
200.0000 mg | ORAL_TABLET | Freq: Three times a day (TID) | ORAL | 0 refills | Status: AC
Start: 2024-03-30 — End: ?

## 2024-03-30 NOTE — ED Provider Notes (Signed)
 [facility]  Note:  This document was prepared using Conservation officer, historic buildings and may include unintentional dictation errors.  MRN: [id number] DOB: [birth year]  Subjective:   Scott Vance is a 58 y.o. male presenting for evaluation of increased urinary frequency, tingling sensation to tip of penis, mild suprapubic abdominal pressure x 1 week.  Patient denies any penile discharge, penile lesions, severe abdominal pain, flank pain, scrotal swelling or pain.  Patient reports that symptoms did emerge after receiving oral sex from a new sexual partner.  Patient is requesting STD screening as well as urinary testing in urgent care today.  No current facility-administered medications for this encounter.  Current Outpatient Medications:    amLODipine -valsartan (EXFORGE) 10-320 MG tablet, Take 1 tablet by mouth daily., Disp: , Rfl:    Ascorbic Acid (VITAMIN C) 1000 MG tablet, Take 1,000 mg by mouth daily., Disp: , Rfl:    cholecalciferol (VITAMIN D3) 25 MCG (1000 UNIT) tablet, Take by mouth., Disp: , Rfl:    CRESTOR 20 MG tablet, Take 20 mg by mouth daily., Disp: , Rfl:    DRISDOL 1.25 MG (50000 UT) capsule, Take 50,000 Units by mouth once a week., Disp: , Rfl:    gabapentin  (NEURONTIN ) 100 MG capsule, Take 1 capsule (100 mg total) by mouth at bedtime., Disp: 14 capsule, Rfl: 0   phenazopyridine (PYRIDIUM) 200 MG tablet, Take 1 tablet (200 mg total) by mouth 3 (three) times daily., Disp: 6 tablet, Rfl: 0   No Known Allergies  Past Medical History:  Diagnosis Date   Acid reflux    Asthma    Asthma 09/22/2022   Hypertension 09/22/2022     History reviewed. No pertinent surgical history.  History reviewed. No pertinent family history.  Social History   Tobacco Use   Smoking status: Some Days    Types: Cigarettes    Passive exposure: Current   Smokeless tobacco: Never   Tobacco comments:    1/2 pack a day   Vaping Use   Vaping status: Never Used  Substance  Use Topics   Alcohol use: No   Drug use: No    ROS Refer to HPI for ROS details.  Objective:    Vitals: BP (!) 144/90 (BP Location: Left Arm)   Pulse 89   Temp 97.6 F (36.4 C)   Resp 18   SpO2 94%   Physical Exam Vitals and nursing note reviewed.  Constitutional:      General: He is not in acute distress.    Appearance: Normal appearance. He is well-developed. He is not ill-appearing or toxic-appearing.  HENT:     Head: Normocephalic.  Cardiovascular:     Rate and Rhythm: Normal rate.  Pulmonary:     Effort: Pulmonary effort is normal. No respiratory distress.  Abdominal:     Palpations: Abdomen is soft.     Tenderness: There is no abdominal tenderness. There is no right CVA tenderness or left CVA tenderness.  Skin:    General: Skin is warm and dry.  Neurological:     General: No focal deficit present.     Mental Status: He is alert and oriented to person, place, and time.  Psychiatric:        Mood and Affect: Mood normal.        Behavior: Behavior normal.     Procedures  Results for orders placed or performed during the hospital encounter of 03/30/24 (from the past 24 hours)  POC urinalysis dipstick  Status: Abnormal   Collection Time: 03/30/24 10:53 AM  Result Value Ref Range   Color, UA yellow yellow   Clarity, UA clear clear   Glucose, UA negative negative mg/dL   Bilirubin, UA negative negative   Ketones, POC UA negative negative mg/dL   Spec Grav, UA 8.979 8.989 - 1.025   Blood, UA trace-lysed (A) negative   pH, UA 5.0 5.0 - 8.0   Protein Ur, POC negative negative mg/dL   Urobilinogen, UA 0.2 0.2 or 1.0 E.U./dL   Nitrite, UA Negative Negative   Leukocytes, UA Negative Negative    Assessment and Plan :     Discharge Instructions       1. Urethritis (Primary) - POC urinalysis dipstick completed in UC shows trace blood, no nitrite, no leukocytes, these findings are not strongly indicative of urinary tract infection. - Cytology  swab-Urethral; GC / Chlamydia, Trichomonas collected in UC and sent to lab for further testing results should be available in 2 to 3 days - Urine Culture collected in UC and sent to lab for further testing results should be available in 2 to 3 days. - phenazopyridine (PYRIDIUM) 200 MG tablet; Take 1 tablet (200 mg total) by mouth 3 (three) times daily.  Dispense: 6 tablet; Refill: 0 -Continue to monitor symptoms for any change in severity if there is any escalation of current symptoms or development of new symptoms follow-up in ER for further evaluation and management.      Zhanae Proffit B Deshannon Seide   Tayvon Culley, [place] B, NP 03/30/24 1130

## 2024-03-30 NOTE — ED Triage Notes (Signed)
 Patient presenting with urinary frequency with tingling sensation, pressure in the lower abdomin. Onset 1 week ago.   Patient requested STD screening as well as urinary testing.

## 2024-03-30 NOTE — Discharge Instructions (Signed)
  1. Urethritis (Primary) - POC urinalysis dipstick completed in UC shows trace blood, no nitrite, no leukocytes, these findings are not strongly indicative of urinary tract infection. - Cytology swab-Urethral; GC / Chlamydia, Trichomonas collected in UC and sent to lab for further testing results should be available in 2 to 3 days - Urine Culture collected in UC and sent to lab for further testing results should be available in 2 to 3 days. - phenazopyridine (PYRIDIUM) 200 MG tablet; Take 1 tablet (200 mg total) by mouth 3 (three) times daily.  Dispense: 6 tablet; Refill: 0 -Continue to monitor symptoms for any change in severity if there is any escalation of current symptoms or development of new symptoms follow-up in ER for further evaluation and management.

## 2024-04-01 LAB — URINE CULTURE: Special Requests: NORMAL

## 2024-04-02 ENCOUNTER — Ambulatory Visit (HOSPITAL_COMMUNITY): Payer: Self-pay

## 2024-04-02 LAB — CYTOLOGY, (ORAL, ANAL, URETHRAL) ANCILLARY ONLY
Chlamydia: NEGATIVE
Comment: NEGATIVE
Comment: NEGATIVE
Comment: NORMAL
Neisseria Gonorrhea: NEGATIVE
Trichomonas: NEGATIVE
